# Patient Record
Sex: Male | Born: 1935 | Race: White | Hispanic: No | Smoking: Former smoker
Health system: Southern US, Community
[De-identification: ages and names within clinical notes are randomized; demographics above are authoritative.]

## PROBLEM LIST (undated history)

## (undated) DIAGNOSIS — I4891 Unspecified atrial fibrillation: Secondary | ICD-10-CM

## (undated) DIAGNOSIS — E785 Hyperlipidemia, unspecified: Secondary | ICD-10-CM

## (undated) DIAGNOSIS — C61 Malignant neoplasm of prostate: Secondary | ICD-10-CM

## (undated) DIAGNOSIS — I2510 Atherosclerotic heart disease of native coronary artery without angina pectoris: Secondary | ICD-10-CM

## (undated) HISTORY — PX: BACK SURGERY: SHX140

---

## 1998-08-13 ENCOUNTER — Emergency Department (HOSPITAL_COMMUNITY): Admission: EM | Admit: 1998-08-13 | Discharge: 1998-08-13 | Payer: Self-pay | Admitting: Emergency Medicine

## 1998-08-13 ENCOUNTER — Encounter: Payer: Self-pay | Admitting: Emergency Medicine

## 1999-01-08 ENCOUNTER — Ambulatory Visit (HOSPITAL_COMMUNITY): Admission: RE | Admit: 1999-01-08 | Discharge: 1999-01-08 | Payer: Self-pay | Admitting: Endocrinology

## 2002-03-10 ENCOUNTER — Ambulatory Visit (HOSPITAL_COMMUNITY): Admission: RE | Admit: 2002-03-10 | Discharge: 2002-03-10 | Payer: Self-pay | Admitting: *Deleted

## 2002-03-10 ENCOUNTER — Encounter (INDEPENDENT_AMBULATORY_CARE_PROVIDER_SITE_OTHER): Payer: Self-pay | Admitting: Specialist

## 2003-01-05 ENCOUNTER — Encounter: Admission: RE | Admit: 2003-01-05 | Discharge: 2003-01-05 | Payer: Self-pay | Admitting: Endocrinology

## 2003-01-05 ENCOUNTER — Encounter: Payer: Self-pay | Admitting: Endocrinology

## 2003-08-10 ENCOUNTER — Ambulatory Visit (HOSPITAL_COMMUNITY): Admission: RE | Admit: 2003-08-10 | Discharge: 2003-08-11 | Payer: Self-pay | Admitting: Cardiology

## 2004-05-22 ENCOUNTER — Ambulatory Visit (HOSPITAL_COMMUNITY): Admission: RE | Admit: 2004-05-22 | Discharge: 2004-05-22 | Payer: Self-pay | Admitting: *Deleted

## 2004-05-22 ENCOUNTER — Encounter (INDEPENDENT_AMBULATORY_CARE_PROVIDER_SITE_OTHER): Payer: Self-pay | Admitting: *Deleted

## 2005-04-12 ENCOUNTER — Encounter: Admission: RE | Admit: 2005-04-12 | Discharge: 2005-04-12 | Payer: Self-pay | Admitting: Endocrinology

## 2005-05-09 ENCOUNTER — Encounter: Admission: RE | Admit: 2005-05-09 | Discharge: 2005-05-09 | Payer: Self-pay | Admitting: Neurosurgery

## 2005-05-26 ENCOUNTER — Encounter: Admission: RE | Admit: 2005-05-26 | Discharge: 2005-05-26 | Payer: Self-pay | Admitting: Neurosurgery

## 2005-06-16 ENCOUNTER — Encounter: Admission: RE | Admit: 2005-06-16 | Discharge: 2005-06-16 | Payer: Self-pay | Admitting: Neurosurgery

## 2005-08-22 ENCOUNTER — Inpatient Hospital Stay (HOSPITAL_COMMUNITY): Admission: RE | Admit: 2005-08-22 | Discharge: 2005-08-26 | Payer: Self-pay | Admitting: Neurosurgery

## 2006-05-18 ENCOUNTER — Encounter: Admission: RE | Admit: 2006-05-18 | Discharge: 2006-05-18 | Payer: Self-pay | Admitting: Neurosurgery

## 2006-08-07 ENCOUNTER — Inpatient Hospital Stay (HOSPITAL_COMMUNITY): Admission: RE | Admit: 2006-08-07 | Discharge: 2006-08-11 | Payer: Self-pay | Admitting: Neurosurgery

## 2006-10-22 ENCOUNTER — Encounter: Admission: RE | Admit: 2006-10-22 | Discharge: 2006-10-22 | Payer: Self-pay | Admitting: Neurosurgery

## 2006-10-28 ENCOUNTER — Encounter: Admission: RE | Admit: 2006-10-28 | Discharge: 2006-10-28 | Payer: Self-pay | Admitting: Neurosurgery

## 2006-10-30 ENCOUNTER — Encounter: Admission: RE | Admit: 2006-10-30 | Discharge: 2006-10-30 | Payer: Self-pay | Admitting: Neurosurgery

## 2007-01-19 ENCOUNTER — Encounter: Admission: RE | Admit: 2007-01-19 | Discharge: 2007-01-19 | Payer: Self-pay | Admitting: Endocrinology

## 2007-01-27 ENCOUNTER — Encounter: Admission: RE | Admit: 2007-01-27 | Discharge: 2007-01-27 | Payer: Self-pay | Admitting: Otolaryngology

## 2007-02-02 ENCOUNTER — Ambulatory Visit (HOSPITAL_BASED_OUTPATIENT_CLINIC_OR_DEPARTMENT_OTHER): Admission: RE | Admit: 2007-02-02 | Discharge: 2007-02-02 | Payer: Self-pay | Admitting: Otolaryngology

## 2007-08-12 ENCOUNTER — Ambulatory Visit (HOSPITAL_COMMUNITY): Admission: RE | Admit: 2007-08-12 | Discharge: 2007-08-12 | Payer: Self-pay | Admitting: *Deleted

## 2007-08-12 ENCOUNTER — Encounter (INDEPENDENT_AMBULATORY_CARE_PROVIDER_SITE_OTHER): Payer: Self-pay | Admitting: *Deleted

## 2007-08-26 ENCOUNTER — Encounter: Admission: RE | Admit: 2007-08-26 | Discharge: 2007-08-26 | Payer: Self-pay | Admitting: Neurosurgery

## 2007-09-10 ENCOUNTER — Encounter: Admission: RE | Admit: 2007-09-10 | Discharge: 2007-09-10 | Payer: Self-pay | Admitting: Neurosurgery

## 2008-01-05 ENCOUNTER — Encounter: Admission: RE | Admit: 2008-01-05 | Discharge: 2008-01-05 | Payer: Self-pay | Admitting: Neurosurgery

## 2008-06-16 ENCOUNTER — Encounter: Admission: RE | Admit: 2008-06-16 | Discharge: 2008-06-16 | Payer: Self-pay | Admitting: Neurosurgery

## 2008-06-30 ENCOUNTER — Encounter: Admission: RE | Admit: 2008-06-30 | Discharge: 2008-06-30 | Payer: Self-pay | Admitting: Neurosurgery

## 2008-09-28 ENCOUNTER — Encounter (INDEPENDENT_AMBULATORY_CARE_PROVIDER_SITE_OTHER): Payer: Self-pay | Admitting: *Deleted

## 2008-09-28 ENCOUNTER — Ambulatory Visit (HOSPITAL_COMMUNITY): Admission: RE | Admit: 2008-09-28 | Discharge: 2008-09-28 | Payer: Self-pay | Admitting: *Deleted

## 2008-12-01 ENCOUNTER — Encounter: Admission: RE | Admit: 2008-12-01 | Discharge: 2008-12-01 | Payer: Self-pay | Admitting: Neurosurgery

## 2009-03-15 ENCOUNTER — Ambulatory Visit: Payer: Self-pay | Admitting: Vascular Surgery

## 2009-04-06 ENCOUNTER — Ambulatory Visit: Payer: Self-pay | Admitting: Vascular Surgery

## 2009-04-12 ENCOUNTER — Ambulatory Visit (HOSPITAL_COMMUNITY): Admission: RE | Admit: 2009-04-12 | Discharge: 2009-04-12 | Payer: Self-pay | Admitting: Surgery

## 2009-04-12 ENCOUNTER — Ambulatory Visit: Payer: Self-pay | Admitting: Surgery

## 2009-06-08 ENCOUNTER — Ambulatory Visit: Payer: Self-pay | Admitting: Vascular Surgery

## 2009-07-16 ENCOUNTER — Encounter: Admission: RE | Admit: 2009-07-16 | Discharge: 2009-07-16 | Payer: Self-pay | Admitting: Neurosurgery

## 2010-12-08 ENCOUNTER — Encounter: Payer: Self-pay | Admitting: Otolaryngology

## 2011-02-25 LAB — POCT I-STAT, CHEM 8
BUN: 14 mg/dL (ref 6–23)
Chloride: 108 mEq/L (ref 96–112)
Creatinine, Ser: 1.1 mg/dL (ref 0.4–1.5)
Potassium: 3.7 mEq/L (ref 3.5–5.1)
Sodium: 142 mEq/L (ref 135–145)

## 2011-03-12 ENCOUNTER — Other Ambulatory Visit: Payer: Self-pay | Admitting: Dermatology

## 2011-04-01 NOTE — Op Note (Signed)
NAME:  JEANMARC, VIERNES NO.:  [id number]   MEDICAL RECORD NO.:  [id number]          PATIENT TYPE:  AMB   LOCATION:  ENDO                         FACILITY:  [facility]   PHYSICIAN:  Georgiana Spinner, M.D.    DATE OF BIRTH:  1925/07/10   DATE OF PROCEDURE:  08/12/2007  DATE OF DISCHARGE:                               OPERATIVE REPORT   PROCEDURE:  Upper endoscopy.   INDICATIONS:  Gastroesophageal reflux disease with Barrett's esophagus.   ANESTHESIA:  Fentanyl 75 mcg, Versed 5 mg.   DESCRIPTION OF PROCEDURE:  With the patient mildly sedated in the left  lateral decubitus position, the Pentax videoscopic endoscope was  inserted and passed under direct vision through the esophagus which  appeared normal until we reached the distal esophagus and there was one  [address] of Barrett's esophagus photographed and biopsied and an  extension of the squamocolumnar junction cephalad that was also  photographed and biopsied which was probably Barrett's esophagus.  I  entered into the stomach through a hiatal hernia.  Fundus, body, antrum,  duodenal bulb, and second portion of the duodenum were visualized. From  this point, the endoscope was slowly withdrawn taking circumferential  views of the duodenal mucosa until the endoscope had been pulled back  into the stomach and placed in retroflexion to view the stomach from  below. The endoscope was straightened and withdrawn taking  circumferential views of the remaining gastric and esophageal mucosa.  The patient's vital signs and pulse oximeter remained stable.  The  patient tolerated procedure well without apparent complication.   FINDINGS:  Barrett's esophagus, biopsied.  Await biopsy report.  The  patient will call me for results and follow up with me as an outpatient.  Proceed to colonoscopy as planned.           ______________________________  Georgiana Spinner, M.D.     GMO/MEDQ  D:  08/12/2007  T:  08/12/2007  Job:   [id number]

## 2011-04-01 NOTE — Op Note (Signed)
NAME:  KEYMARI, SATO NO.:  [id number]   MEDICAL RECORD NO.:  [id number]          PATIENT TYPE:  AMB   LOCATION:  ENDO                         FACILITY:  [facility]   PHYSICIAN:  Georgiana Spinner, M.D.    DATE OF BIRTH:  10-22-1925   DATE OF PROCEDURE:  09/28/2008  DATE OF DISCHARGE:                               OPERATIVE REPORT   PROCEDURE:  Upper endoscopy with biopsy.   INDICATIONS:  GERD.   ANESTHESIA:  Fentanyl 50 mcg, Versed 7.5 mg.   DESCRIPTION OF PROCEDURE:  With the patient mildly sedated in the left  lateral decubitus position the Pentax videoscopic endoscope was inserted  in the mouth, passed under direct vision through the esophagus which  appeared normal on direct view then into the stomach, fundus, body,  antrum, duodenal bulb, second portion of duodenum were visualized.  From  this point the endoscope was slowly withdrawn taking circumferential  views of duodenal mucosa until the endoscope had been pulled back into  stomach placed in retroflexion to view the stomach from below.  The  endoscope was then straightened after a biopsy of what appeared to be a  possible small area of Barrett's was photographed in retroflex view and  biopsied.  The endoscope was straightened and withdrawn taking  circumferential views of remaining gastric and esophageal mucosa.  The  patient's vital signs and pulse oximeter remained stable.  The patient  tolerated the procedure well without apparent complications.   FINDINGS:  1. Single arteriovenous malformation in the fundus of the stomach,      photographed.  2. Question of Barrett's esophagus versus normal variant.   PLAN:  Await biopsy report.  The patient will call me for results and  follow-up with me as an outpatient.           ______________________________  Georgiana Spinner, M.D.     GMO/MEDQ  D:  09/28/2008  T:  09/28/2008  Job:  [id number]

## 2011-04-01 NOTE — Procedures (Signed)
LOWER EXTREMITY ARTERIAL EVALUATION-SINGLE LEVEL   INDICATION:  Followup evaluation of right leg stent.   HISTORY:  Diabetes:  No.  Cardiac:  No.  Hypertension:  No.  Smoking:  No.  Previous Surgery:  Right superficial femoral artery stent placed on  04/12/2009 by Dr. Myra Gianotti.   RESTING SYSTOLIC PRESSURES: (ABI)                          RIGHT                LEFT  Brachial:               132                  140  Anterior tibial:        156                  178  Posterior tibial:       180                  178  Peroneal:  DOPPLER WAVEFORM ANALYSIS:  Anterior tibial:        Biphasic             Biphasic  Posterior tibial:       Triphasic            Triphasic  Peroneal:   PREVIOUS ABI'S:  Date:  03/15/2009  RIGHT:  0.77  LEFT:  >1.0   IMPRESSION:  1. Right ABI has increased since previous study.  2. Left ABI is stable compared to previous study.  3. ABIs and Doppler arterial waveforms suggest no significant arterial      occlusive disease bilaterally and patent right superficial femoral      artery stent.   ___________________________________________  Larina Earthly, M.D.   MC/MEDQ  D:  06/08/2009  T:  06/08/2009  Job:  [id number]

## 2011-04-01 NOTE — Consult Note (Signed)
NEW PATIENT CONSULTATION   Todd Wilkinson, Todd Wilkinson  DOB:  08/10/1925                                       04/06/2009  CHART#:[id number]   Todd Wilkinson presents today for evaluation of right calf claudication  symptoms.  He is a very pleasant, active 77 year old gentleman who  reports that over the past several months has increasingly severe  disability related to claudication in his right calf.  He reports that  if he continues to push, that he will have thigh and hip claudication as  well.  He denies any rest pain.  He has never had any episodes of tissue  loss.  He reports this is worse if he tries to walk fast or up a hill.   PAST MEDICAL HISTORY:  Remarkable for very good health.  He denies major  medical difficulties such as cardiac dysfunction.   SOCIAL HISTORY:  He is married with 2 children.  He is retired.  He does  not smoke or drink alcohol.   REVIEW OF SYSTEMS:  Negative for cardiac, pulmonary, GI or GU  discomfort.   ALLERGIES TO MEDICATIONS:  None.   CURRENT MEDICATIONS:  Advil, lorazepam, hydrocodone as needed for back  pain.  He has had multiple back surgeries.   PHYSICAL EXAMINATION:  Well-developed, well-nourished white male  appearing younger than stated age of 70.  His blood pressure is 161/88, pulse of 80, respirations 18.  His radial pulses are 2+ bilaterally.  His carotid arteries are without  bruits bilaterally.  He is grossly intact neurologically.  His femoral  pulses are 2+ bilaterally.  I do not palpate a pedal pulse on the right.  He does have 2+ dorsalis pedis pulse on the left.   I reviewed his noninvasive vascular laboratory studies with him from  March 15, 2009.  This reveals normal pressures and waveforms on the  left.  On the right he has monophasic waveforms with an ankle-arm index  of 0.77.  I have recommended arteriography for further evaluation.  I  explained that he may be a candidate for stenting depending on the level  of  his stenosis and occlusive disease.  He understands that we will  proceed with arteriography as an outpatient on Apr 12, 2009.   Larina Earthly, M.D.  Electronically Signed   TFE/MEDQ  D:  04/06/2009  T:  04/09/2009  Job:  [id number]   cc:   Payton Doughty, M.D.  Brooke Bonito, M.D.

## 2011-04-01 NOTE — Op Note (Signed)
NAME:  Todd Wilkinson, Todd Wilkinson NO.:  [id number]   MEDICAL RECORD NO.:  [id number]          PATIENT TYPE:  AMB   LOCATION:  ENDO                         [facility]ACILITY:  [facility]   PHYSICIAN:  Georgiana Spinner, M.D.    DATE OF BIRTH:  [birth year]   DATE OF PROCEDURE:  08/12/2007  DATE OF DISCHARGE:                               OPERATIVE REPORT   PROCEDURE:  Colonoscopy.   ANESTHESIA:  Fentanyl 25 mcg, Versed 2 mg.   DESCRIPTION OF PROCEDURE:  With the patient mildly sedated in the left  lateral decubitus position, a rectal examination was performed.  The  prostate felt symmetrically enlarged but no nodules were noted.  Subsequently, the Pentax videoscopic colonoscope was inserted into the  rectum and passed under direct vision to the cecum identified by the  ileocecal valve and appendiceal orifice, both of which were  photographed. In the cecum were two small polyps that were removed using  hot biopsy forceps technique setting of 20/150 blended current.  From  this point, the colonoscope was slowly withdrawn taking circumferential  views of the remaining colonic mucosa stopping first at the hepatic  flexure where a small polyp was seen, photographed, and it too was  removed, this time using snare cautery technique with the same setting.  Tissue was retrieved by suctioning through a tissue trap. The endoscope  was then withdrawn all the way to 40 cm from anal verge at which point  two small polyps were again seen, both of these were also removed using  hot biopsy forceps technique with the same setting. We next stopped in  the rectum which appeared normal on direct and showed hemorrhoids on  retroflexed view. The endoscope was straightened and withdrawn.  The  patient's vital signs and pulse oximeter remained stable.  The patient  tolerated the procedure well without apparent complications.   FINDINGS:  Internal hemorrhoids, polyp at 40 cm, polyp at hepatic  flexure, and polyp at  the cecum.   PLAN:  Await biopsy report.  The patient will call me for results and  follow up with me as an outpatient.           ______________________________  Georgiana Spinner, M.D.     GMO/MEDQ  D:  08/12/2007  T:  08/12/2007  Job:  [id number]

## 2011-04-01 NOTE — Assessment & Plan Note (Signed)
OFFICE VISIT   URI, TURNBOUGH  DOB:  Nov 12, 1925                                       06/08/2009  DGUYQ#:[id number]   The patient presents today for followup of his aortogram and subsequent  stenting of his right superficial femoral artery on 04/12/2009.  He  reports that he is not able to walk to his full capacity due to the hot  weather but reports that he has had no calf claudication.  He did have  successful stenting of his right superficial femoral artery.   PHYSICAL EXAM:  His femoral pulses are 2+.  He has 2+ popliteal pulses  bilaterally and palpable posterior tibial pulses.  His ankle arm index  has returned to normal bilaterally up from 0.77 on the right previously.   I am quite pleased with his initial result as is the patient.  He will  continue his walking program.  I explained that there chance for  recurrence of his superficial femoral artery occlusive disease and if so  we would recommend treatment depending on his level of disability.  He  will follow up in our office with vascular lab protocol.   Larina Earthly, M.D.  Electronically Signed   TFE/MEDQ  D:  06/08/2009  T:  06/09/2009  Job:  [id number]   cc:   Brooke Bonito, M.D.  Payton Doughty, M.D.  Jorge Ny, MD

## 2011-04-01 NOTE — Op Note (Signed)
NAME:  Todd, Wilkinson                ACCOUNT NO.:  [id number]   MEDICAL RECORD NO.:  [id number]          PATIENT TYPE:  AMB   LOCATION:  SDS                          FACILITY:  [facility]   PHYSICIAN:  Juleen China IV, MDDATE OF BIRTH:  [birth year]   DATE OF PROCEDURE:  04/12/2009  DATE OF DISCHARGE:  04/12/2009                               OPERATIVE REPORT   DIAGNOSIS:  Right leg claudication.   PROCEDURE:  1. Ultrasound access, left femoral artery.  2. Abdominal aortogram.  3. Bilateral lower extremity runoff.  4. Third-order catheterization.  5. Stent, right superficial femoral artery.   INDICATIONS FOR PROCEDURE:  This is an 77 year old gentleman with severe  claudication in his right leg.  Duplex reveals diminished ankle brachial  indices on the right, normal on the left.  The patient comes in today  for arteriogram, possible intervention.   PROCEDURE:  The patient was identified in the holding area and taken to  the room where he was placed supine on the table.  Bilateral groins were  prepped and draped in standard sterile fashion.  Time-out was called.  The left femoral artery was evaluated with ultrasound and found to be  widely patent.  Lidocaine 1% was used for local anesthesia.  Left  femoral artery was accessed under ultrasound guidance with an 18-gauge  needle.  An 0.35 Teena Dunk wire was advanced in retrograde fashion to the  abdominal aorta under fluoroscopic visualization.  A 5-French sheath was  placed.  Over the wire, an Omni flush catheter was placed at the level  of L1.  Abdominal aortogram was obtained.  Next, the catheter was pulled  down to the aortic bifurcation.  Pelvic angiogram was obtained.  The  aortic bifurcation was then crossed using the Omni flush catheter and a  Benson wire.  The catheter was placed in the right external iliac artery  and right lower extremity runoff was obtained.  Left leg runoff was  performed from retrograde injections to the  sheath.   FINDINGS:  Aortogram:  The visualized portions of the suprarenal  abdominal aorta showed minimal disease.  There are single renal  arteries, which are widely patent.  The infrarenal abdominal aorta is  widely patent without significant disease.   Pelvic Angiogram:  Bilateral common iliac arteries are widely patent  without significant disease.  Bilateral hypogastric arteries are widely  patent without significant disease.  Bilateral external iliac arteries  are widely patent without significant disease.   Right Lower Extremity:  The right common femoral artery is widely patent  without significant disease.  The right profunda femoral artery is  widely patent.  The right superficial femoral artery is widely patent,  however, at the level of the adductor canal, there is a high grade  approximately 95% stenosis.  The popliteal arteries are patent  throughout its course.  There was 2-vessel runoff via the posterior  tibial and peroneal artery.  The anterior tibial artery is chronically  occluded.   Left Lower Extremity:  Left common femoral artery is widely patent  without significant disease.  The left profunda  femoral artery is widely  patent.  The left superficial femoral artery is widely patent.  The left  popliteal artery is widely patent.  There is 2 vessel runoff via the  posterior tibial and peroneal artery.  The anterior tibial artery is  occluded.  At this point in time, the decision was made to intervene  over a Rosen wire.  A 7-French sheath was placed to the right external  iliac artery.  The patient was then given systemic heparinization using  a 7 x 2 balloon catheter and a Benson wire access was obtained distal to  the lesion.  A long Rosen wire was then placed.  Contrast injections  were then performed to localize the lesion.  Once this had been done, a  Cordis 8 x 30 self-expanding stent was deployed across the lesion.  This  was molded to confirmation with a  7 x 2 balloon.  Followup arteriogram  revealed significant reduction in the stenosis less than 10%, runoff  remained unchanged.  At this point in time, the decision was made to  terminate the procedure.  Sheath was pulled back to the left external  iliac artery.  The patient will be taken to the holding area for sheath  pull once his coagulation profile corrects.   IMPRESSION:  High-grade right superficial femoral artery stenosis,  successfully treated using a Cordis 8 x 30 self-expanding stent.      Todd Ny, MD  Electronically Signed     VWB/MEDQ  D:  04/12/2009  T:  04/12/2009  Job:  [id number]

## 2011-04-04 NOTE — H&P (Signed)
NAME:  Todd Wilkinson, Todd Wilkinson NO.:  [id number]   MEDICAL RECORD NO.:  [id number]          PATIENT TYPE:  INP   LOCATION:  2856                         FACILITY:  [facility]   PHYSICIAN:  Payton Doughty, M.D.      DATE OF BIRTH:  1925/03/15   DATE OF ADMISSION:  08/22/2005  DATE OF DISCHARGE:                                HISTORY & PHYSICAL   ADMITTING DIAGNOSES:  Spondylosis at L3-4.   This is a very nice 77 year old right-handed white gentleman I operated on  10 years ago for spondylosis at L2-3 and he did well.  Came to the office in  June.  Had been having increasing pain in back, down his left leg.  MR  demonstrated a foraminal disk at L3-4.  He underwent epidural steroids to no  avail and is now admitted for extension of his 2-3 fusion down to 3-4.   MEDICAL HISTORY:  Unremarkable for significant diseases.  He has just had  the back operations in the past.  He has been on Voltaren in the past.   SOCIAL HISTORY:  He does not smoke.  Drinks alcohol on a social basis.  Avid  golfer.  He is retired now.   No allergies.   PHYSICAL EXAMINATION:  HEENT:  Within normal limits.  NECK:  He has good range of motion of the neck.  CHEST:  Clear.  CARDIAC:  Unremarkable.  ABDOMEN:  Nontender.  No hepatosplenomegaly.  EXTREMITIES:  Without clubbing or cyanosis.  GENITOURINARY:  Deferred.  PERIPHERAL PULSES:  Good.  NEUROLOGIC:  He is awake, alert, and oriented.  His cranial nerves are  intact.  Motor examination demonstrates 5/5 strength throughout the upper  and lower extremities.  He has a very positive straight leg raise on the  right side and he has absent knee jerk on the right.   MR results have been reviewed above.   CLINICAL IMPRESSION:  Herniated disk at 3-4 unresponsive to treatment.   PLAN:  Laminectomy, diskectomy, posterior __________ fusion at L3-4 and  extension into pedicle screw construct from L2 down to L4.  The risks and  benefits of this approach have been  discussed with him and he wished to  proceed.          ______________________________  Payton Doughty, M.D.    MWR/MEDQ  D:  08/22/2005  T:  08/22/2005  Job:  [id number]

## 2011-04-04 NOTE — Op Note (Signed)
NAME:  Todd Wilkinson, Todd Wilkinson                          ACCOUNT NO.:  [id number]   MEDICAL RECORD NO.:  [id number]                   PATIENT TYPE:  AMB   LOCATION:  ENDO                                 FACILITY:  [facility]   PHYSICIAN:  Georgiana Spinner, M.D.                 DATE OF BIRTH:  February 17, 1925   DATE OF PROCEDURE:  05/22/2004  DATE OF DISCHARGE:                                 OPERATIVE REPORT   PROCEDURE:  Upper endoscopy with biopsy.   INDICATIONS:  Barrett's esophagus.   ANESTHESIA:  1. Demerol 50.  2. Versed 5 mg.   PROCEDURE:  With the patient mildly sedated in the left lateral decubitus  position, the Olympus videoscopic endoscope was inserted in the mouth,  passed under direct vision through the esophagus which appeared normal  except for a question of an early stricture.  We entered into the distal  esophagus and there were changes of Barrett's photographed and biopsied.  We  entered then into the stomach, fundus, body, antrum.  The duodenal bulb,  second portion of duodenum were visualized.  From this point the endoscope  was slowly withdrawn, taking circumferential views of duodenal mucosa until  the endoscope had been pulled back and the stomach placed in retroflexion to  view the stomach from below.  The endoscope was then straightened and  withdrawn, taking circumferential views of the remaining gastric and  esophageal mucosa.  The patient's vital signs and pulse oximeter remained  stable.  The patient tolerated the procedure well with no apparent  complications.   FINDINGS:  Barrett's esophagus seen and biopsied, await biopsy report.  Patient will call me for results and followup with me as an outpatient,  proceed to colonoscopy.                                               Georgiana Spinner, M.D.    GMO/MEDQ  D:  05/22/2004  T:  05/22/2004  Job:  [id number]

## 2011-04-04 NOTE — Op Note (Signed)
NAME:  Todd Wilkinson, Todd Wilkinson NO.:  [id number]   MEDICAL RECORD NO.:  [id number]          PATIENT TYPE:  INP   LOCATION:  2899                         FACILITY:  [facility]   PHYSICIAN:  Payton Doughty, M.D.      DATE OF BIRTH:  07/01/1925   DATE OF PROCEDURE:  08/07/2006  DATE OF DISCHARGE:                                 OPERATIVE REPORT   PREOPERATIVE DIAGNOSIS:  Spondylosis, L4-5.   POSTOPERATIVE DIAGNOSIS:  Spondylosis, L4-5.   OPERATIVE PROCEDURES:  L4-5 laminectomy and diskectomy, posterior lumbar  interbody fusion with Ray fusion cages; segmental pedicle screw fixation  from L4 to S1; posterolateral arthrodesis, L3 to S1.   SURGEON:  Payton Doughty, M.D.   DOCTOR ASSISTANT:  Clydene Fake, M.D.   NURSE ASSISTANT:  [place].   ANESTHESIA:  General endotracheal.   PREPARATION:  Prep was done with Betadine Scrub and alcohol wipe.   COMPLICATIONS:  None.   BODY OF TEXT:  This is a 77 year old gentleman with a prior fusion at 2-3  and 3-4, with transitional segment disease at 4-5, taken to the operating  room, smoothly anesthetized, intubated, placed prone on the operating table.  Following shave, prep and drape in the usual sterile fashion, the skin was  infiltrated with 1% lidocaine with 1:400,000 epinephrine, and the skin was  incised from L2 to S1.  The pedicle screws at L2, L3 and L4, the lamina of  L4, L5 and S1, and the transverse processes of L3, L4, L5 and S1 were  exposed bilaterally in the subperiosteal plane.  The pars interarticularis,  lamina and inferior facet of L4, and the superior facet of L5 were removed  using the high-speed drill.  L4 and 5 were decompressed.  There was a lot of  spondylosis with severe compression of the 4 and the 5 roots, particularly  off to the left side.  Following complete decompression, a 14 x 21-mm Ray  fusion cages were placed and packed with bone graft harvested from the facet  joints.  Pedicle screws were then placed  in L5 and S1, and the old rod was  removed from the other screws and the new rod placed, 120 mm, that extended  from L2 to S1.  The transverse processes and lamina of S1 were decorticated  with a high-speed drill.  BMP on the extender matrix was placed bilaterally.  Intraoperative x-rays showed good placement of Ray cages, pedicle screws and  rods.  Final tightening was undertaken.  The fascia and subcutaneous tissues  were reapproximated with 0 Vicryl in  interrupted fashion.  Subcuticular tissues were reapproximated with 2-0  Vicryl in interrupted fashion.  The skin was closed with 3-0 nylon in a  running, locked fashion.  Betadine and Telfa dressing were applied and made  occlusive with OpSite, and the patient returned to the recovery room in good  condition.           ______________________________  Payton Doughty, M.D.     MWR/MEDQ  D:  08/07/2006  T:  08/07/2006  Job:  [id number]  cc:   Clydene Fake, M.D.

## 2011-04-04 NOTE — H&P (Signed)
NAME:  Todd Wilkinson, Todd Wilkinson NO.:  [id number]   MEDICAL RECORD NO.:  [id number]          PATIENT TYPE:  INP   LOCATION:  3006                         FACILITY:  [facility]   PHYSICIAN:  Payton Doughty, M.D.      DATE OF BIRTH:  1925/02/13   DATE OF ADMISSION:  08/07/2006  DATE OF DISCHARGE:                                HISTORY & PHYSICAL   ADMITTING DIAGNOSES:  Blood loss.  16109-6.   Dictating doctor, Dr. Roy/Neurosurgery.   This is a 77 year old gentleman who has been infused at 2-3 and 3-4 in the  past, has developed transitional segment disease at 4-5 and has severe left  leg pain, is now admitted for decompression and fusion, extension down to 4-  5 and 5-1.   MEDICAL HISTORY:  Is remarkable for a little bit of prostatic hypertrophy.  He takes UroXatral 10 mg a day, uses Nexium 40 mg a day, MiraLax, oxycodone  on a p.r.n. basis, Ativan on a p.r.n. basis.   ALLERGIES:  NONE.   SURGICAL HISTORY:  Lumbar fusions as noted above.   SOCIAL HISTORY:  He does not smoke, drinks alcohol on a social basis and  golfs a lot.   REVIEW OF SYSTEMS:  Remarkable for back pain, leg pain, wearing glasses.  HEENT:  Within normal limits.  Neck:  He has a reasonable range of motion in  his neck.  Chest:  Clear.  Cardiac:  Regular rate and rhythm.  Abdomen:  Nontender with no hepatosplenomegaly.  Extremities:  Without clubbing or  cyanosis.  GU:  Deferred.  Extremities:  Peripheral pulses are good.  Neurologic:  He is awake, alert and oriented.  His cranial nerves are  intact.  Motor exam shows 5/5 strength throughout the upper and lower  extremities.  Same for the dorsiflexors on the left that are 5-/5.  There is  a left L5 sensory deficit.  Reflexes are 2 at the knees, absent at the  ankles, toes downgoing bilaterally.  Straight leg raise is positive on the  left.   Review of his MR and plain films show effusion with no loosening at 2-3 and  3-4, transitional disease at 4-5,  relatively normal, although somewhat  degenerated disk at 5-1.   CLINICAL IMPRESSION:  Left L5 radiculopathy with spondylosis and progressive  transitional segment disease at 4-5.   PLAN:  Effusion at 4-5 and 5-1, an incorporation of __________  to the 2-3,  3-4 fusion.  The risks and benefits has been discussed with him.  He wishes  to proceed.          ______________________________  Payton Doughty, M.D.    MWR/MEDQ  D:  08/07/2006  T:  08/09/2006  Job:  [id number]

## 2011-04-04 NOTE — Discharge Summary (Signed)
NAME:  Todd Wilkinson, Todd Wilkinson NO.:  [id number]   MEDICAL RECORD NO.:  [id number]          PATIENT TYPE:  INP   LOCATION:  3009                         FACILITY:  [facility]   PHYSICIAN:  Payton Doughty, M.D.      DATE OF BIRTH:  [birth year]   DATE OF ADMISSION:  08/22/2005  DATE OF DISCHARGE:  08/26/2005                                 DISCHARGE SUMMARY   ADMISSION DIAGNOSIS:  Spondylosis, L3-4.   DISCHARGE DIAGNOSIS:  Spondylosis, L3-4.   OPERATIVE PROCEDURE:  L3-4 laminectomy, diskectomy, posterior lumbar  interbody fusion, Ray Threaded Fusion Cage, pedicle screws, posterolateral  arthrodesis.   COMPLICATIONS:  None.   DISCHARGE STATUS:  Alive and well.   BODY OF TEXT:  A 77 year old gentleman whose history and physical is  recounted on the chart.  He had a fusion at L2-3 numerous years ago and has  developed adjacent segment disease at L3-4.   Medical history is remarkable for coronary artery disease.  He has a stent  in place.  Has no angina.   He was admitted after ascertainment of normal laboratory values and  underwent extensive __________ and fusion down to L3-4.  Postoperatively he  has done well.  He was on a PCA for two days, that was stopped.  He is on  oral pain medications and Lyrica now.  He has some pain in his left hip, but  his strength is full.  His incision is dry and well-healing.  He is being  discharged home in the care of his family on Lyrica and Percocet.  His  follow-up will be in the [facility] office in a week  for sutures.           ______________________________  Payton Doughty, M.D.     MWR/MEDQ  D:  08/26/2005  T:  08/26/2005  Job:  [id number]

## 2011-04-04 NOTE — Op Note (Signed)
NAME:  [name], [name]                ACCOUNT NO.:  [id number]   MEDICAL RECORD NO.:  [id number]          PATIENT TYPE:  AMB   LOCATION:  DSC                          FACILITY:  [facility]   PHYSICIAN:  Christopher E. 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   POSTOPERATIVE DIAGNOSIS:  Left laryngeal mass versus inflammation.   OPERATION:  Direct laryngoscopy, cervical esophagoscopy.   SURGEON:  Narda Bonds, M.D.   ANESTHESIA:  General.   COMPLICATIONS:  None.   CLINICAL NOTE:  Todd Wilkinson is a 77 year old gentleman who about 2 weeks  ago developed relatively sudden onset of hoarseness and difficulty  swallowing.  He was seen in the office with left vocal cord paralysis,  tendency for aspiration with complaints of left ear pain and left throat  discomfort.  He subsequently underwent a MRI scan and on the MRI scan  this was suggestive of a mass in the left side of the larynx around the  region of the left arytenoid extending epiglottic as well supraglottic.  Since the MRI scan, his symptoms and actually improved and on repeat  exam in the office, he appeared to regain some mobility of the left  vocal cord with a little bit improvement of his  voice but still having  some difficulty swallowing but not as much.  Because of findings on MRI  scan suggesting a possible neoplasm on the left side of larynx, direct  laryngoscopy and cervical esophagoscopy is performed at this time.   DESCRIPTION OF PROCEDURE:  After adequate endotracheal anesthesia with a  #6 endotracheal tube, first direct laryngoscopy was performed.  On  evaluation of the vocal cords, both vocal cords appeared symmetric and  clear.  There is no vocal cord abnormalities, subglottic area likewise  appeared clear with no subglottic mass noted.  Evaluation of the  left  arytenoid revealed some erythema, slight swelling, fullness of the left  arytenoid but this was mucosal covered and was soft to palpation with  the biopsy forceps.  Postcricoid area was likewise evaluated with direct  laryngoscopy and postcricoid area was mucosalized with no obvious  lesions noted.  The piriform sinus was clear.  Cervical esophagoscopy  was performed and on cervical esophagoscopy the upper cervical esophagus  had no mucosal abnormalities noted.  Several photos were obtained.  No  biopsies were obtained as everything was mucosal covered, there was no  mucosal lesions.  There is some slight erythema and fullness of the left  arytenoid area but this was fairly minimal.  This completed the  procedure.  [name] was awoke from anesthesia and transferred to the  recovery room postop doing well.   DISPOSITION:  [name] is discharged home later this morning, placed on  Keflex 500 mg b.i.d. for 1 week. Will have him follow-up in my office in  1 week for recheck.  If inflammatory symptoms persist, may consider use  of steroid dose pack.  Will follow-up in one  week for recheck.           ______________________________  Kristine Garbe. Ezzard Standing, M.D.     CEN/MEDQ  D:  02/02/2007  T:  02/02/2007  Job:  [id number]   cc:   Brooke Bonito, M.D.

## 2011-04-04 NOTE — Discharge Summary (Signed)
NAME:  Todd Wilkinson, CUMMING NO.:  [id number]   MEDICAL RECORD NO.:  [id number]          PATIENT TYPE:  INP   LOCATION:  3006                         FACILITY:  [facility]   PHYSICIAN:  Payton Doughty, M.D.      DATE OF BIRTH:  1925/05/26   DATE OF ADMISSION:  08/07/2006  DATE OF DISCHARGE:  08/11/2006                                 DISCHARGE SUMMARY   ADMITTING DIAGNOSIS:  Spondylosis L4-5, L5-S1.   DISCHARGE DIAGNOSIS:  Spondylosis L4-5, L5-S1.   OPERATION/PROCEDURE:  Was L4-5 laminectomy discectomy posterior lumbar  __________ fusion cages.  L4-5, L5-S1 segmental Pedicle screw fixation and  posterolateral arthrodesis.   COMPLICATIONS:  None.   HISTORY OF PRESENT ILLNESS:  This is a 77 year old gentleman whose history  and physical is recounted in the chart.  He has been fused 2-3 and 3-4.  He  developed spondylosis at 4-5 with severe L5 radiculopathy.  He was admitted  after I ascertained normal laboratory values and underwent fusions as  outlined above.  Postoperatively, he has done extremely well with intact  strength and sensation and no leg pain.  He spent a day in the step down  unit with monitoring, had no difficulties there.  He has been transferred  out to floor.  He has developed a small amount of swelling of his incision.  It is not particularly worrisome.  There is no redness.  He has no fever and  no drainage.  He has done well in physical therapy.  He is currently ready  to be discharged home in the care of his family with Percocet for pain,  Cipro for catheter coverage for about 5 days.  He is to follow up with me in  the [facility] Office in about 10 days for suture  removal.           ______________________________  Payton Doughty, M.D.     MWR/MEDQ  D:  08/11/2006  T:  08/11/2006  Job:  [id number]

## 2011-04-04 NOTE — Procedures (Signed)
[facility]  Patient:    MARTINEZ, BOXX Visit Number: [id number] MRN: [id number]          Service Type: END Location: ENDO Attending Physician:  Sabino Gasser Dictated by:   Sabino Gasser, M.D. Proc. Date: 03/10/02 Admit Date:  03/10/2002   CC:         Bernadene Person, M.D.   Procedure Report  PROCEDURE:  Colonoscopy.  INDICATION:  Colon polyps.  ANESTHESIA:  Demerol 60, Versed 4 mg.  DESCRIPTION OF PROCEDURE:  With the patient mildly sedated in the left lateral decubitus position, a rectal exam was performed which was unremarkable. Subsequently, the Olympus videoscopic colonoscope was inserted into the rectum and passed under direct vision to the cecum, identified by the ileocecal valve and appendiceal orifice, both of which were photographed.  From this point, the colonoscope was slowly withdrawn, taking circumferential views of the entire colonic mucosa, stopping first at 25 cm from the anal verge, at which point a very large polyp was seen on a pedicle.  This was photographed and subsequently using snare cautery technique setting of 20-20 blended current, it was removed, and there was good hemostasis.  We then had to grasp this with a Roth retrieval basket to ensnare it and bring this out through the rectum. We then reinserted the endoscope back to this level where there was some residual polypoid tissue seen, and it too was removed by using snare cautery technique again a setting of 20-20 blended current, and it was suctioned through the scope.  We then used the hot biopsy forceps technique to grasp one small area of residual polyp and remove it using cautery.  Once accomplished, the endoscope was withdrawn approximately 5 cm, where a second polyp was seen, and it too was removed using snare cautery technique again a setting of 20-20 blended current.  The endoscope was then withdrawn to the rectum which appeared normal on direct and  retroflexed view.  The endoscope was straightened and withdrawn.  The patients vital signs and pulse oximeter remained stable.  The patient tolerated the procedure well without apparent complications.  FINDINGS:  One large and one moderately large polyp found in the sigmoid colon.  PLAN: 1. Await biopsy reports. 2. The patient will call me for results and follow up with me as an    outpatient. Dictated by:   Sabino Gasser, M.D. Attending Physician:  Sabino Gasser DD:  03/10/02 TD:  03/10/02 Job: [id number]

## 2011-04-04 NOTE — Op Note (Signed)
NAME:  Todd Wilkinson, Todd Wilkinson NO.:  [id number]   MEDICAL RECORD NO.:  [id number]          PATIENT TYPE:  INP   LOCATION:  2856                         FACILITY:  [facility]   PHYSICIAN:  Payton Doughty, M.D.      DATE OF BIRTH:  [birth year]   DATE OF PROCEDURE:  08/22/2005  DATE OF DISCHARGE:                                 OPERATIVE REPORT   PREOPERATIVE DIAGNOSIS:  Spondylosis L3-L4.   POSTOPERATIVE DIAGNOSIS:  Spondylosis L3-L4.   OPERATIVE PROCEDURE:  L3-L4 laminectomy, discectomy, posterior lumbar  interbody fusion with Ray fusion cages, nonsegmental pedicle screw fixation,  and posterolateral arthrodesis.   SURGEON:  Payton Doughty, M.D.   SERVICE:  Neurosurgery   ANESTHESIA:  General endotracheal anesthesia.   PREPARATION:  Betadine and alcohol wipe.   COMPLICATIONS:  None.   ASSISTANT:  Danae Orleans. Venetia Maxon, M.D.   BODY OF TEXT:  77 year old gentleman with severe spondylosis at L3-L4, taken  to the operating room, smoothly anesthetized and intubated, placed prone on  the operating table.  Following shave, prep, and drape in the usual sterile  fashion, the old skin incision was reopened and the pedicle screws at L2 and  L3 were identified.  Working inferiorly to that, the lamina of L3 and the  transverse process and pedicle of L4 were identified.  Interoperative x-ray  confirmed correct level.  Having confirmed correct level, the pars intra-  articularis, lamina, and inferior facet of L3 and superior facet of L4 were  removed bilaterally using the high speed drill.  This allowed removal of the  3-4 disc as well as dissection of the L3 and L4 nerve roots as they  traversed this area.  Following complete decompression of the nerve roots  and discectomy, fusion cages 12 by 21 mm were placed.  They were packed with  bone graft harvested from the facet joints.  The old hardware was removed at  L2 and L3 and replaced with 90D screws, 6.25 by 40 mm.  The pedicle at L4  was  probed and pedicle screws placed there.  The interoperative x-ray showed  good placement of the screws and the rod was attached to them after  appropriate contouring.  The transverse process of L3 and L4 were  decorticated with the high speed drill and BNP as infused was placed in the  intertransverse position.  Interoperative x-ray showed good placement of the Ray cages, pedicle screws,  and rods.  The caps were tightened appropriately.  The incision was closed  with successive layers of 0 Vicryl, 2-0 Vicryl, and 3-0 nylon.  The wound  was dressed with Betadine, Telfa, and OpSite.  The patient returned to the  recovery room in good condition.           ______________________________  Payton Doughty, M.D.     MWR/MEDQ  D:  08/22/2005  T:  08/23/2005  Job:  [id number]

## 2011-04-04 NOTE — Op Note (Signed)
NAME:  Todd Wilkinson, Todd Wilkinson                          ACCOUNT NO.:  [id number]   MEDICAL RECORD NO.:  [id number]                   PATIENT TYPE:  AMB   LOCATION:  ENDO                                 FACILITY:  [facility]   PHYSICIAN:  Georgiana Spinner, M.D.                 DATE OF BIRTH:  1925-10-26   DATE OF PROCEDURE:  05/22/2004  DATE OF DISCHARGE:                                 OPERATIVE REPORT   PROCEDURE:  Colonoscopy.   INDICATIONS:  Colon polyps.   ANESTHESIA:  1. Demerol 20.  2. Versed 2 mg.   PROCEDURE:  With patient mildly sedated in the left lateral decubitus  position rectal exam was performed which was unremarkable.  Subsequently,  the Olympus videoscopic colonoscope was inserted in the rectum and passed  under direct vision to the cecum, identified by ileocecal valve and  appendiceal orifice, both of which were photographed.  We viewed the  ileocecal valve and I could not tell if there was not adenomatous change of  the valve.  This was photographed and subsequently we placed the endoscope  in a retroflexed view in the cecum to view the valve from below and it  appeared that this was probably just valvular tissue, but I elected to  biopsy it to be certain.  Then centrally the endoscope was then straightened  and withdrawn, taking circumferential views of the remaining colonic mucosa,  stopping at 20 cm from the anal verge, at which point a polyp was seen,  photographed and removed using hot biopsy forceps.   TECHNIQUE:  At this time, with a setting of 20/200, the endoscope was  withdrawn to the rectum and in the rectum there was what appeared to be a  polyp very distal, just above the dentate line.  This was photographed and  removed using snare cautery technique.  We only got part of the polyp in  this view, so we straightened the endoscope and removed the remainder of the  tissue on direct view.  We also used hot biopsy forceps technique to  cauterize the base.  There was no  bleeding seen and there was only residual  burnt tissue which was photographed.  The endoscope was withdrawn.  The  patient's vital signs and pulse oximetry remained stable.  Patient tolerated  the procedure well without apparent complications.   FINDINGS:  Polyp at 20 cm from the anal verge, polyp of anal canal, and  question of polyp at the ileocecal valve.   PLAN:  Await biopsy reports.  Patient will call me for results and followup  with me as an outpatient.  Avoid aspirin for 2 weeks.  Georgiana Spinner, M.D.    GMO/MEDQ  D:  05/22/2004  T:  05/22/2004  Job:  [id number]

## 2011-04-04 NOTE — Procedures (Signed)
[facility]  Patient:    Todd Wilkinson, Todd Wilkinson Visit Number: [id number] MRN: [id number]          Service Type: END Location: ENDO Attending Physician:  Sabino Gasser Dictated by:   Sabino Gasser, M.D. Proc. Date: 03/10/02 Admit Date:  03/10/2002   CC:         Bernadene Person, M.D.   Procedure Report  PROCEDURE:  Upper endoscopy.  INDICATION:  Gastroesophageal reflux disease.  ANESTHESIA:  Demerol 60, Versed 6 mg.  DESCRIPTION OF PROCEDURE:  With patient mildly sedated in the left lateral decubitus position, the Olympus videoscopic endoscope was inserted into the mouth and then passed under into the esophagus under direct vision until we reached the distal esophagus where Barretts esophagus was seen and photographed and biopsied.  We entered into the stomach through a hiatal hernia.  Fundus, body, antrum, duodenal bulb, and second portion of duodenum appeared normal.  From this point, the endoscope was slowly withdrawn, taking circumferential views of the entire duodenal mucosa until the endoscope then pulled back into the stomach and placed in retroflexion to view the stomach from below.  The endoscope was then straightened and withdrawn, taking circumferential views of the remaining gastric and esophageal mucosa, stopping at the proximal esophagus where what appeared to be a gastric inlet patch was seen and biopsied.  The patients vital signs and pulse oximeter remained stable.  The patient tolerated the procedure well without apparent complications.  FINDINGS: 1. Barretts esophagus clearly seen, photographed and biopsied. 2. Gastric inlet patch, rule out Barretts, was biopsied.  PLAN: 1. Await biopsy report. 2. The patient will call me for results and follow up with me as an    outpatient. 3, Proceed to colonoscopy as planned. Dictated by:   Sabino Gasser, M.D. Attending Physician:  Sabino Gasser DD:  03/10/02 TD:  03/10/02 Job:  [id number]

## 2011-07-16 ENCOUNTER — Other Ambulatory Visit: Payer: Self-pay | Admitting: Dermatology

## 2011-11-17 ENCOUNTER — Other Ambulatory Visit: Payer: Self-pay | Admitting: Dermatology

## 2012-06-04 ENCOUNTER — Encounter: Payer: Self-pay | Admitting: Vascular Surgery

## 2012-09-22 ENCOUNTER — Other Ambulatory Visit: Payer: Self-pay | Admitting: Dermatology

## 2013-03-24 ENCOUNTER — Other Ambulatory Visit: Payer: Self-pay | Admitting: Dermatology

## 2013-04-05 ENCOUNTER — Other Ambulatory Visit: Payer: Self-pay | Admitting: *Deleted

## 2013-04-07 ENCOUNTER — Other Ambulatory Visit (HOSPITAL_COMMUNITY): Payer: Self-pay | Admitting: Vascular Surgery

## 2013-04-07 ENCOUNTER — Other Ambulatory Visit (HOSPITAL_COMMUNITY): Payer: Self-pay | Admitting: Cardiovascular Disease

## 2013-04-07 DIAGNOSIS — R0989 Other specified symptoms and signs involving the circulatory and respiratory systems: Secondary | ICD-10-CM

## 2013-04-07 DIAGNOSIS — I714 Abdominal aortic aneurysm, without rupture: Secondary | ICD-10-CM

## 2013-04-12 ENCOUNTER — Encounter: Payer: Self-pay | Admitting: Cardiovascular Disease

## 2013-04-13 ENCOUNTER — Encounter: Payer: Self-pay | Admitting: Cardiovascular Disease

## 2013-04-20 ENCOUNTER — Encounter (HOSPITAL_COMMUNITY): Payer: Self-pay

## 2013-05-12 ENCOUNTER — Ambulatory Visit (HOSPITAL_COMMUNITY)
Admission: RE | Admit: 2013-05-12 | Discharge: 2013-05-12 | Disposition: A | Discharge status: Home / Self Care | Payer: Medicare Other | Source: Ambulatory Visit | Attending: Internal Medicine | Admitting: Internal Medicine

## 2013-05-12 ENCOUNTER — Other Ambulatory Visit (HOSPITAL_COMMUNITY): Payer: Self-pay | Admitting: Cardiovascular Disease

## 2013-05-12 ENCOUNTER — Encounter (HOSPITAL_COMMUNITY): Payer: Self-pay

## 2013-05-12 DIAGNOSIS — I714 Abdominal aortic aneurysm, without rupture, unspecified: Secondary | ICD-10-CM | POA: Insufficient documentation

## 2013-05-12 DIAGNOSIS — R0989 Other specified symptoms and signs involving the circulatory and respiratory systems: Secondary | ICD-10-CM

## 2013-05-12 NOTE — Progress Notes (Signed)
Carotid Duplex Completed. Kimmberly Wisser, RDMS, RVT  

## 2013-05-12 NOTE — Progress Notes (Signed)
Aorta Duplex Completed. Kannon Granderson, RDMS, RVT  

## 2013-05-25 ENCOUNTER — Other Ambulatory Visit: Payer: Self-pay | Admitting: Neurosurgery

## 2013-05-25 DIAGNOSIS — M47817 Spondylosis without myelopathy or radiculopathy, lumbosacral region: Secondary | ICD-10-CM

## 2013-05-27 ENCOUNTER — Ambulatory Visit
Admission: RE | Admit: 2013-05-27 | Discharge: 2013-05-27 | Disposition: A | Discharge status: Home / Self Care | Payer: Medicare Other | Source: Ambulatory Visit | Attending: Neurosurgery | Admitting: Neurosurgery

## 2013-05-27 DIAGNOSIS — M47817 Spondylosis without myelopathy or radiculopathy, lumbosacral region: Secondary | ICD-10-CM

## 2013-06-17 ENCOUNTER — Other Ambulatory Visit: Payer: Self-pay | Admitting: Neurosurgery

## 2013-06-17 DIAGNOSIS — M47816 Spondylosis without myelopathy or radiculopathy, lumbar region: Secondary | ICD-10-CM

## 2013-06-23 ENCOUNTER — Other Ambulatory Visit: Payer: Self-pay | Admitting: Neurosurgery

## 2013-06-23 ENCOUNTER — Ambulatory Visit
Admission: RE | Admit: 2013-06-23 | Discharge: 2013-06-23 | Disposition: A | Discharge status: Home / Self Care | Payer: Medicare Other | Source: Ambulatory Visit | Attending: Neurosurgery | Admitting: Neurosurgery

## 2013-06-23 VITALS — BP 161/81 | HR 63

## 2013-06-23 DIAGNOSIS — M47816 Spondylosis without myelopathy or radiculopathy, lumbar region: Secondary | ICD-10-CM

## 2013-06-23 MED ORDER — IOHEXOL 180 MG/ML  SOLN
1.0000 mL | Freq: Once | INTRAMUSCULAR | Status: AC | PRN
  Administered 2013-06-23: 1 mL via INTRA_ARTICULAR

## 2013-06-23 MED ORDER — METHYLPREDNISOLONE ACETATE 40 MG/ML INJ SUSP (RADIOLOG
120.0000 mg | Freq: Once | INTRAMUSCULAR | Status: AC
  Administered 2013-06-23: 120 mg via INTRA_ARTICULAR

## 2014-06-12 ENCOUNTER — Other Ambulatory Visit: Payer: Self-pay | Admitting: Dermatology

## 2014-11-03 ENCOUNTER — Other Ambulatory Visit: Payer: Self-pay | Admitting: Endocrinology

## 2014-11-03 DIAGNOSIS — R222 Localized swelling, mass and lump, trunk: Secondary | ICD-10-CM

## 2014-11-06 ENCOUNTER — Ambulatory Visit
Admission: RE | Admit: 2014-11-06 | Discharge: 2014-11-06 | Disposition: A | Discharge status: Home / Self Care | Payer: Medicare Other | Source: Ambulatory Visit | Attending: Endocrinology | Admitting: Endocrinology

## 2014-11-06 ENCOUNTER — Other Ambulatory Visit: Payer: Self-pay | Admitting: Endocrinology

## 2014-11-06 DIAGNOSIS — R222 Localized swelling, mass and lump, trunk: Secondary | ICD-10-CM

## 2014-11-27 DIAGNOSIS — H35372 Puckering of macula, left eye: Secondary | ICD-10-CM | POA: Diagnosis not present

## 2014-11-27 DIAGNOSIS — H43812 Vitreous degeneration, left eye: Secondary | ICD-10-CM | POA: Diagnosis not present

## 2015-01-15 DIAGNOSIS — E789 Disorder of lipoprotein metabolism, unspecified: Secondary | ICD-10-CM | POA: Diagnosis not present

## 2015-01-15 DIAGNOSIS — N400 Enlarged prostate without lower urinary tract symptoms: Secondary | ICD-10-CM | POA: Diagnosis not present

## 2015-01-15 DIAGNOSIS — R42 Dizziness and giddiness: Secondary | ICD-10-CM | POA: Diagnosis not present

## 2015-01-15 DIAGNOSIS — M549 Dorsalgia, unspecified: Secondary | ICD-10-CM | POA: Diagnosis not present

## 2015-04-17 DIAGNOSIS — Z0100 Encounter for examination of eyes and vision without abnormal findings: Secondary | ICD-10-CM | POA: Diagnosis not present

## 2015-05-22 DIAGNOSIS — L821 Other seborrheic keratosis: Secondary | ICD-10-CM | POA: Diagnosis not present

## 2015-05-22 DIAGNOSIS — D1801 Hemangioma of skin and subcutaneous tissue: Secondary | ICD-10-CM | POA: Diagnosis not present

## 2015-05-22 DIAGNOSIS — D692 Other nonthrombocytopenic purpura: Secondary | ICD-10-CM | POA: Diagnosis not present

## 2015-05-22 DIAGNOSIS — L570 Actinic keratosis: Secondary | ICD-10-CM | POA: Diagnosis not present

## 2015-05-22 DIAGNOSIS — Z85828 Personal history of other malignant neoplasm of skin: Secondary | ICD-10-CM | POA: Diagnosis not present

## 2015-05-22 DIAGNOSIS — L723 Sebaceous cyst: Secondary | ICD-10-CM | POA: Diagnosis not present

## 2015-07-04 DIAGNOSIS — Z0000 Encounter for general adult medical examination without abnormal findings: Secondary | ICD-10-CM | POA: Diagnosis not present

## 2015-07-04 DIAGNOSIS — Z125 Encounter for screening for malignant neoplasm of prostate: Secondary | ICD-10-CM | POA: Diagnosis not present

## 2015-07-04 DIAGNOSIS — E780 Pure hypercholesterolemia: Secondary | ICD-10-CM | POA: Diagnosis not present

## 2015-07-11 DIAGNOSIS — N400 Enlarged prostate without lower urinary tract symptoms: Secondary | ICD-10-CM | POA: Diagnosis not present

## 2015-07-11 DIAGNOSIS — I10 Essential (primary) hypertension: Secondary | ICD-10-CM | POA: Diagnosis not present

## 2015-07-11 DIAGNOSIS — E789 Disorder of lipoprotein metabolism, unspecified: Secondary | ICD-10-CM | POA: Diagnosis not present

## 2015-07-12 DIAGNOSIS — M47816 Spondylosis without myelopathy or radiculopathy, lumbar region: Secondary | ICD-10-CM | POA: Diagnosis not present

## 2015-08-14 DIAGNOSIS — N400 Enlarged prostate without lower urinary tract symptoms: Secondary | ICD-10-CM | POA: Diagnosis not present

## 2015-08-14 DIAGNOSIS — I10 Essential (primary) hypertension: Secondary | ICD-10-CM | POA: Diagnosis not present

## 2015-08-17 DIAGNOSIS — N403 Nodular prostate with lower urinary tract symptoms: Secondary | ICD-10-CM | POA: Diagnosis not present

## 2015-08-17 DIAGNOSIS — R972 Elevated prostate specific antigen [PSA]: Secondary | ICD-10-CM | POA: Diagnosis not present

## 2015-09-25 DIAGNOSIS — N400 Enlarged prostate without lower urinary tract symptoms: Secondary | ICD-10-CM | POA: Diagnosis not present

## 2015-09-25 DIAGNOSIS — E109 Type 1 diabetes mellitus without complications: Secondary | ICD-10-CM | POA: Diagnosis not present

## 2015-09-25 DIAGNOSIS — R42 Dizziness and giddiness: Secondary | ICD-10-CM | POA: Diagnosis not present

## 2015-10-01 DIAGNOSIS — H9202 Otalgia, left ear: Secondary | ICD-10-CM | POA: Diagnosis not present

## 2015-10-01 DIAGNOSIS — R42 Dizziness and giddiness: Secondary | ICD-10-CM | POA: Diagnosis not present

## 2015-11-27 DIAGNOSIS — N400 Enlarged prostate without lower urinary tract symptoms: Secondary | ICD-10-CM | POA: Diagnosis not present

## 2015-11-27 DIAGNOSIS — Z79899 Other long term (current) drug therapy: Secondary | ICD-10-CM | POA: Diagnosis not present

## 2015-11-27 DIAGNOSIS — I10 Essential (primary) hypertension: Secondary | ICD-10-CM | POA: Diagnosis not present

## 2015-12-03 DIAGNOSIS — N400 Enlarged prostate without lower urinary tract symptoms: Secondary | ICD-10-CM | POA: Diagnosis not present

## 2015-12-03 DIAGNOSIS — E789 Disorder of lipoprotein metabolism, unspecified: Secondary | ICD-10-CM | POA: Diagnosis not present

## 2016-02-11 DIAGNOSIS — R972 Elevated prostate specific antigen [PSA]: Secondary | ICD-10-CM | POA: Diagnosis not present

## 2016-02-11 DIAGNOSIS — N401 Enlarged prostate with lower urinary tract symptoms: Secondary | ICD-10-CM | POA: Diagnosis not present

## 2016-02-25 DIAGNOSIS — E789 Disorder of lipoprotein metabolism, unspecified: Secondary | ICD-10-CM | POA: Diagnosis not present

## 2016-02-25 DIAGNOSIS — I10 Essential (primary) hypertension: Secondary | ICD-10-CM | POA: Diagnosis not present

## 2016-03-03 DIAGNOSIS — R972 Elevated prostate specific antigen [PSA]: Secondary | ICD-10-CM | POA: Diagnosis not present

## 2016-03-04 DIAGNOSIS — E789 Disorder of lipoprotein metabolism, unspecified: Secondary | ICD-10-CM | POA: Diagnosis not present

## 2016-03-04 DIAGNOSIS — F419 Anxiety disorder, unspecified: Secondary | ICD-10-CM | POA: Diagnosis not present

## 2016-03-04 DIAGNOSIS — N400 Enlarged prostate without lower urinary tract symptoms: Secondary | ICD-10-CM | POA: Diagnosis not present

## 2016-04-23 DIAGNOSIS — M47816 Spondylosis without myelopathy or radiculopathy, lumbar region: Secondary | ICD-10-CM | POA: Diagnosis not present

## 2016-08-13 DIAGNOSIS — H01001 Unspecified blepharitis right upper eyelid: Secondary | ICD-10-CM | POA: Diagnosis not present

## 2016-08-13 DIAGNOSIS — H35373 Puckering of macula, bilateral: Secondary | ICD-10-CM | POA: Diagnosis not present

## 2016-08-13 DIAGNOSIS — H01002 Unspecified blepharitis right lower eyelid: Secondary | ICD-10-CM | POA: Diagnosis not present

## 2016-08-13 DIAGNOSIS — H26491 Other secondary cataract, right eye: Secondary | ICD-10-CM | POA: Diagnosis not present

## 2016-08-13 DIAGNOSIS — H5213 Myopia, bilateral: Secondary | ICD-10-CM | POA: Diagnosis not present

## 2016-11-06 DIAGNOSIS — E789 Disorder of lipoprotein metabolism, unspecified: Secondary | ICD-10-CM | POA: Diagnosis not present

## 2016-11-06 DIAGNOSIS — E7800 Pure hypercholesterolemia, unspecified: Secondary | ICD-10-CM | POA: Diagnosis not present

## 2016-11-06 DIAGNOSIS — I10 Essential (primary) hypertension: Secondary | ICD-10-CM | POA: Diagnosis not present

## 2016-11-13 DIAGNOSIS — K5901 Slow transit constipation: Secondary | ICD-10-CM | POA: Diagnosis not present

## 2016-11-13 DIAGNOSIS — G629 Polyneuropathy, unspecified: Secondary | ICD-10-CM | POA: Diagnosis not present

## 2017-02-06 DIAGNOSIS — E789 Disorder of lipoprotein metabolism, unspecified: Secondary | ICD-10-CM | POA: Diagnosis not present

## 2017-02-06 DIAGNOSIS — I10 Essential (primary) hypertension: Secondary | ICD-10-CM | POA: Diagnosis not present

## 2017-02-11 DIAGNOSIS — E789 Disorder of lipoprotein metabolism, unspecified: Secondary | ICD-10-CM | POA: Diagnosis not present

## 2017-02-11 DIAGNOSIS — M549 Dorsalgia, unspecified: Secondary | ICD-10-CM | POA: Diagnosis not present

## 2017-02-11 DIAGNOSIS — N400 Enlarged prostate without lower urinary tract symptoms: Secondary | ICD-10-CM | POA: Diagnosis not present

## 2017-03-03 DIAGNOSIS — Z85828 Personal history of other malignant neoplasm of skin: Secondary | ICD-10-CM | POA: Diagnosis not present

## 2017-03-03 DIAGNOSIS — D044 Carcinoma in situ of skin of scalp and neck: Secondary | ICD-10-CM | POA: Diagnosis not present

## 2017-03-03 DIAGNOSIS — L570 Actinic keratosis: Secondary | ICD-10-CM | POA: Diagnosis not present

## 2017-03-03 DIAGNOSIS — N400 Enlarged prostate without lower urinary tract symptoms: Secondary | ICD-10-CM | POA: Diagnosis not present

## 2017-03-03 DIAGNOSIS — I10 Essential (primary) hypertension: Secondary | ICD-10-CM | POA: Diagnosis not present

## 2017-03-03 DIAGNOSIS — L820 Inflamed seborrheic keratosis: Secondary | ICD-10-CM | POA: Diagnosis not present

## 2017-06-03 DIAGNOSIS — R0989 Other specified symptoms and signs involving the circulatory and respiratory systems: Secondary | ICD-10-CM | POA: Diagnosis not present

## 2017-06-24 DIAGNOSIS — N400 Enlarged prostate without lower urinary tract symptoms: Secondary | ICD-10-CM | POA: Diagnosis not present

## 2017-06-24 DIAGNOSIS — R42 Dizziness and giddiness: Secondary | ICD-10-CM | POA: Diagnosis not present

## 2017-06-24 DIAGNOSIS — E7800 Pure hypercholesterolemia, unspecified: Secondary | ICD-10-CM | POA: Diagnosis not present

## 2017-06-24 DIAGNOSIS — H9202 Otalgia, left ear: Secondary | ICD-10-CM | POA: Diagnosis not present

## 2017-07-17 DIAGNOSIS — H9202 Otalgia, left ear: Secondary | ICD-10-CM | POA: Diagnosis not present

## 2017-07-17 DIAGNOSIS — M270 Developmental disorders of jaws: Secondary | ICD-10-CM | POA: Diagnosis not present

## 2017-07-17 DIAGNOSIS — R42 Dizziness and giddiness: Secondary | ICD-10-CM | POA: Diagnosis not present

## 2017-07-17 DIAGNOSIS — H6122 Impacted cerumen, left ear: Secondary | ICD-10-CM | POA: Diagnosis not present

## 2017-07-17 DIAGNOSIS — J343 Hypertrophy of nasal turbinates: Secondary | ICD-10-CM | POA: Diagnosis not present

## 2017-08-19 ENCOUNTER — Telehealth: Payer: Self-pay | Admitting: Cardiology

## 2017-08-19 NOTE — Telephone Encounter (Signed)
Received records from [facility] for appointment on 09/23/17 with Dr Ellyn Hack.  Records put with Dr Allison Quarry schedule for 09/23/17. lp

## 2017-08-21 DIAGNOSIS — H8113 Benign paroxysmal vertigo, bilateral: Secondary | ICD-10-CM | POA: Diagnosis not present

## 2017-09-23 ENCOUNTER — Ambulatory Visit: Payer: Self-pay | Admitting: Cardiology

## 2017-10-19 DIAGNOSIS — Z0000 Encounter for general adult medical examination without abnormal findings: Secondary | ICD-10-CM | POA: Diagnosis not present

## 2017-10-19 DIAGNOSIS — Z1212 Encounter for screening for malignant neoplasm of rectum: Secondary | ICD-10-CM | POA: Diagnosis not present

## 2017-10-19 DIAGNOSIS — E7800 Pure hypercholesterolemia, unspecified: Secondary | ICD-10-CM | POA: Diagnosis not present

## 2017-10-19 DIAGNOSIS — I2510 Atherosclerotic heart disease of native coronary artery without angina pectoris: Secondary | ICD-10-CM | POA: Diagnosis not present

## 2017-10-22 ENCOUNTER — Encounter: Payer: Self-pay | Admitting: Internal Medicine

## 2017-11-18 DIAGNOSIS — C4492 Squamous cell carcinoma of skin, unspecified: Secondary | ICD-10-CM | POA: Diagnosis not present

## 2017-11-18 DIAGNOSIS — C44629 Squamous cell carcinoma of skin of left upper limb, including shoulder: Secondary | ICD-10-CM | POA: Diagnosis not present

## 2017-12-03 DIAGNOSIS — D0462 Carcinoma in situ of skin of left upper limb, including shoulder: Secondary | ICD-10-CM | POA: Diagnosis not present

## 2017-12-03 DIAGNOSIS — C44629 Squamous cell carcinoma of skin of left upper limb, including shoulder: Secondary | ICD-10-CM | POA: Diagnosis not present

## 2017-12-17 ENCOUNTER — Other Ambulatory Visit: Payer: Self-pay

## 2017-12-17 ENCOUNTER — Emergency Department (HOSPITAL_COMMUNITY): Payer: Medicare Other

## 2017-12-17 ENCOUNTER — Emergency Department (HOSPITAL_COMMUNITY)
Admission: EM | Admit: 2017-12-17 | Discharge: 2017-12-17 | Disposition: A | Discharge status: Home / Self Care | Payer: Medicare Other | Attending: Emergency Medicine | Admitting: Emergency Medicine

## 2017-12-17 ENCOUNTER — Encounter (HOSPITAL_COMMUNITY): Payer: Self-pay | Admitting: Emergency Medicine

## 2017-12-17 DIAGNOSIS — Y939 Activity, unspecified: Secondary | ICD-10-CM | POA: Insufficient documentation

## 2017-12-17 DIAGNOSIS — S60511A Abrasion of right hand, initial encounter: Secondary | ICD-10-CM | POA: Insufficient documentation

## 2017-12-17 DIAGNOSIS — M542 Cervicalgia: Secondary | ICD-10-CM | POA: Insufficient documentation

## 2017-12-17 DIAGNOSIS — M546 Pain in thoracic spine: Secondary | ICD-10-CM | POA: Diagnosis not present

## 2017-12-17 DIAGNOSIS — Y999 Unspecified external cause status: Secondary | ICD-10-CM | POA: Insufficient documentation

## 2017-12-17 DIAGNOSIS — S199XXA Unspecified injury of neck, initial encounter: Secondary | ICD-10-CM | POA: Diagnosis not present

## 2017-12-17 DIAGNOSIS — Y929 Unspecified place or not applicable: Secondary | ICD-10-CM | POA: Insufficient documentation

## 2017-12-17 DIAGNOSIS — Z041 Encounter for examination and observation following transport accident: Secondary | ICD-10-CM | POA: Diagnosis present

## 2017-12-17 DIAGNOSIS — S40811A Abrasion of right upper arm, initial encounter: Secondary | ICD-10-CM | POA: Diagnosis not present

## 2017-12-17 DIAGNOSIS — S80211A Abrasion, right knee, initial encounter: Secondary | ICD-10-CM | POA: Insufficient documentation

## 2017-12-17 DIAGNOSIS — S299XXA Unspecified injury of thorax, initial encounter: Secondary | ICD-10-CM | POA: Diagnosis not present

## 2017-12-17 MED ORDER — DICLOFENAC EPOLAMINE 1.3 % TD PTCH
1.0000 | MEDICATED_PATCH | Freq: Two times a day (BID) | TRANSDERMAL | Status: DC
  Administered 2017-12-17: 1 via TRANSDERMAL
  Filled 2017-12-17: qty 1

## 2017-12-17 MED ORDER — DICLOFENAC EPOLAMINE 1.3 % TD PTCH: 1.0000 | MEDICATED_PATCH | Freq: Two times a day (BID) | TRANSDERMAL | 0 refills | Status: AC

## 2017-12-17 MED ORDER — BACITRACIN ZINC 500 UNIT/GM EX OINT
TOPICAL_OINTMENT | CUTANEOUS | Status: AC
  Administered 2017-12-17: 1
  Filled 2017-12-17: qty 0.9
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
## 2018-06-03 DIAGNOSIS — R5383 Other fatigue: Secondary | ICD-10-CM | POA: Diagnosis not present

## 2018-08-25 ENCOUNTER — Emergency Department (HOSPITAL_COMMUNITY)
Admission: EM | Admit: 2018-08-25 | Discharge: 2018-08-25 | Disposition: A | Discharge status: Home / Self Care | Payer: Medicare Other | Attending: Emergency Medicine | Admitting: Emergency Medicine

## 2018-08-25 ENCOUNTER — Other Ambulatory Visit: Payer: Self-pay

## 2018-08-25 ENCOUNTER — Encounter (HOSPITAL_COMMUNITY): Payer: Self-pay | Admitting: *Deleted

## 2018-08-25 DIAGNOSIS — K5900 Constipation, unspecified: Secondary | ICD-10-CM | POA: Diagnosis not present

## 2018-08-25 DIAGNOSIS — Z79899 Other long term (current) drug therapy: Secondary | ICD-10-CM | POA: Diagnosis not present

## 2018-08-25 DIAGNOSIS — K625 Hemorrhage of anus and rectum: Secondary | ICD-10-CM | POA: Diagnosis not present

## 2018-08-25 LAB — URINALYSIS, ROUTINE W REFLEX MICROSCOPIC
BILIRUBIN URINE: NEGATIVE
Glucose, UA: NEGATIVE mg/dL
Hgb urine dipstick: NEGATIVE
KETONES UR: NEGATIVE mg/dL
Leukocytes, UA: NEGATIVE
NITRITE: NEGATIVE
PROTEIN: NEGATIVE mg/dL
Specific Gravity, Urine: 1.02 (ref 1.005–1.030)
pH: 5.5 (ref 5.0–8.0)

## 2018-08-25 LAB — CBC
HCT: 35.9 % — ABNORMAL LOW (ref 39.0–52.0)
HEMOGLOBIN: 11.8 g/dL — AB (ref 13.0–17.0)
MCH: 34 pg (ref 26.0–34.0)
MCHC: 32.9 g/dL (ref 30.0–36.0)
MCV: 103.5 fL — ABNORMAL HIGH (ref 80.0–100.0)
Platelets: 144 10*3/uL — ABNORMAL LOW (ref 150–400)
RBC: 3.47 MIL/uL — AB (ref 4.22–5.81)
RDW: 13.7 % (ref 11.5–15.5)
WBC: 6.5 10*3/uL (ref 4.0–10.5)
nRBC: 0 % (ref 0.0–0.2)

## 2018-08-25 LAB — COMPREHENSIVE METABOLIC PANEL
ALBUMIN: 3.5 g/dL (ref 3.5–5.0)
ALT: 18 U/L (ref 0–44)
ANION GAP: 9 (ref 5–15)
AST: 22 U/L (ref 15–41)
Alkaline Phosphatase: 58 U/L (ref 38–126)
BILIRUBIN TOTAL: 0.9 mg/dL (ref 0.3–1.2)
BUN: 10 mg/dL (ref 8–23)
CO2: 24 mmol/L (ref 22–32)
Calcium: 8.9 mg/dL (ref 8.9–10.3)
Chloride: 106 mmol/L (ref 98–111)
Creatinine, Ser: 1.09 mg/dL (ref 0.61–1.24)
GFR calc Af Amer: >60 mL/min (ref >60)
GFR calc non Af Amer: 58 mL/min — ABNORMAL LOW (ref >60)
GLUCOSE: 116 mg/dL — AB (ref 70–99)
POTASSIUM: 3.9 mmol/L (ref 3.5–5.1)
SODIUM: 139 mmol/L (ref 135–145)
TOTAL PROTEIN: 6.4 g/dL — AB (ref 6.5–8.1)

## 2018-08-25 LAB — TYPE AND SCREEN
ABO/RH(D): A NEG
ANTIBODY SCREEN: NEGATIVE

## 2018-08-25 NOTE — Discharge Instructions (Addendum)
Call gastroenterology today to schedule an appointment. Avoid constipation, take home Miralax twice daily. Return to the Ed if you experience severe worsening of your symptoms, rectal pain, loss of consciousness.

## 2018-08-25 NOTE — ED Triage Notes (Signed)
Pt in c/o possible rectal vs scrotal bleeding, pt believes the bleeding is coming from his scrotal area, was sitting on his toilet attempting to have a bowel movement and noted the toilet was filled with blood, denies pain at this time, does report he was straining to have his bowel movement, possibly blood in his urine

## 2018-08-25 NOTE — ED Provider Notes (Signed)
[facility] EMERGENCY DEPARTMENT Provider Note   CSN: [id number] Arrival date & time: 08/25/18  2633     History   Chief Complaint Chief Complaint  Patient presents with  . Rectal Bleeding    HPI BOWE SIDOR is a 84 y.o. male with a pmhx of AAA, who presented to the ED today complaining of rectal bleeding. Pt reports that he has chronic constipation. He was straining to have a BM this AM and notcied BRB in the toilet. He was not sure if this was oming from his bottom or genitals. No prior hx of GI bleed. Denies any rectal pain, abdominal pain, genital/scrotal pain. He has subsequently urinated and it was clear/yellow. He has never had colonoscopy before. No known family hx of GI malignancy. No NSAID use, very rare Aleve intake only a few times a year. No anticoagulation.   HPI  History reviewed. No pertinent past medical history.  There are no active problems to display for this patient.   Past Surgical History:  Procedure Laterality Date  . BACK SURGERY          Home Medications    Prior to Admission medications   Medication Sig Start Date End Date Taking? Authorizing Provider  Ascorbic Acid (VITAMIN C PO) Take 1 tablet by mouth daily.    [provider]  LORazepam (ATIVAN) 1 MG tablet Take 1 mg by mouth every 8 (eight) hours as needed for anxiety.    [provider]  omeprazole (PRILOSEC) 20 MG capsule Take 20 mg by mouth 2 (two) times daily as needed.    [provider]  tamsulosin (FLOMAX) 0.4 MG CAPS Take 0.4 mg by mouth daily.    [provider]    Family History History reviewed. No pertinent family history.  Social History Social History   Tobacco Use  . Smoking status: Not on file  Substance Use Topics  . Alcohol use: Not on file  . Drug use: Not on file     Allergies   Patient has no known allergies.   Review of Systems Review of Systems  All other systems reviewed and are  negative.    Physical Exam Updated Vital Signs BP (!) 142/63 (BP Location: Right Arm)   Pulse 68   Temp 98 F (36.7 C) (Oral)   Resp 16   SpO2 96%   Physical Exam  Constitutional: He is oriented to person, place, and time. He appears well-developed and well-nourished. No distress.  HENT:  Head: Normocephalic and atraumatic.  Mouth/Throat: No oropharyngeal exudate.  Eyes: Pupils are equal, round, and reactive to light. Conjunctivae and EOM are normal. Right eye exhibits no discharge. Left eye exhibits no discharge. No scleral icterus.  Cardiovascular: Normal rate, regular rhythm, normal heart sounds and intact distal pulses. Exam reveals no gallop and no friction rub.  No murmur heard. Pulmonary/Chest: Effort normal and breath sounds normal. No respiratory distress. He has no wheezes. He has no rales. He exhibits no tenderness.  Abdominal: Soft. He exhibits no distension. There is no tenderness. There is no guarding.  Genitourinary: Rectum normal and penis normal.  Genitourinary Comments: No gross blood on external rectal exam, no obvious external hemorrhoids.  Scrotum atraumatic, no bleeding noted from this area.   Musculoskeletal: Normal range of motion. He exhibits no edema.  Neurological: He is alert and oriented to person, place, and time.  Skin: Skin is warm and dry. No rash noted. He is not diaphoretic. No erythema. No  pallor.  Psychiatric: He has a normal mood and affect. His behavior is normal.  Nursing note and vitals reviewed.    ED Treatments / Results  Labs (all labs ordered are listed, but only abnormal results are displayed) Labs Reviewed  COMPREHENSIVE METABOLIC PANEL - Abnormal; Notable for the following components:      Result Value   Glucose, Bld 116 (*)    Total Protein 6.4 (*)    GFR calc non Af Amer 58 (*)    All other components within normal limits  CBC - Abnormal; Notable for the following components:   RBC 3.47 (*)    Hemoglobin 11.8 (*)    HCT  35.9 (*)    MCV 103.5 (*)    Platelets 144 (*)    All other components within normal limits  URINALYSIS, ROUTINE W REFLEX MICROSCOPIC  POC OCCULT BLOOD, ED  TYPE AND SCREEN    EKG None  Radiology No results found.  Procedures Procedures (including critical care time)  Medications Ordered in ED Medications - No data to display   Initial Impression / Assessment and Plan / ED Course  I have reviewed the triage vital signs and the nursing notes.  Pertinent labs & imaging results that were available during my care of the patient were reviewed by me and considered in my medical decision making (see chart for details).     Very functional 84 y.o M presents with acute osnet BRBPR in the setting of straining to have a BM today. Bleeding was painless. No prior hx of GIB. No anticoagulation. NO obvious blood on external rectal exam. Hgb stable at 11.8. Possibly internal hemorrhoid bleed. He has never had colonoscopy before. Feel that pt is stable for discharge with close GI follow up. WIll discharge home with bid Miralax to avoid constipation/straining. Return precautions outlined in patient discharge instructions.   Patient was discussed with and seen by Dr. Venora Maples who agrees with the treatment plan.    Final Clinical Impressions(s) / ED Diagnoses   Final diagnoses:  None    ED Discharge Orders    None       Carlos Levering, PA-C 08/25/18 1342    Jola Schmidt, MD 08/25/18 1553

## 2018-08-30 DIAGNOSIS — K625 Hemorrhage of anus and rectum: Secondary | ICD-10-CM | POA: Diagnosis not present

## 2018-08-31 DIAGNOSIS — D649 Anemia, unspecified: Secondary | ICD-10-CM | POA: Diagnosis not present

## 2018-09-08 ENCOUNTER — Other Ambulatory Visit: Payer: Self-pay | Admitting: Gastroenterology

## 2018-09-08 DIAGNOSIS — K625 Hemorrhage of anus and rectum: Secondary | ICD-10-CM | POA: Diagnosis not present

## 2018-09-08 DIAGNOSIS — K219 Gastro-esophageal reflux disease without esophagitis: Secondary | ICD-10-CM | POA: Diagnosis not present

## 2018-09-08 DIAGNOSIS — I2510 Atherosclerotic heart disease of native coronary artery without angina pectoris: Secondary | ICD-10-CM | POA: Diagnosis not present

## 2018-09-13 ENCOUNTER — Emergency Department (HOSPITAL_COMMUNITY)
Admission: EM | Admit: 2018-09-13 | Discharge: 2018-09-13 | Disposition: A | Discharge status: Home / Self Care | Payer: Medicare Other | Attending: Emergency Medicine | Admitting: Emergency Medicine

## 2018-09-13 ENCOUNTER — Emergency Department (HOSPITAL_BASED_OUTPATIENT_CLINIC_OR_DEPARTMENT_OTHER): Payer: Medicare Other

## 2018-09-13 ENCOUNTER — Encounter (HOSPITAL_COMMUNITY): Payer: Self-pay | Admitting: *Deleted

## 2018-09-13 DIAGNOSIS — R000 Tachycardia, unspecified: Secondary | ICD-10-CM | POA: Diagnosis not present

## 2018-09-13 DIAGNOSIS — I480 Paroxysmal atrial fibrillation: Secondary | ICD-10-CM | POA: Diagnosis not present

## 2018-09-13 DIAGNOSIS — R21 Rash and other nonspecific skin eruption: Secondary | ICD-10-CM | POA: Diagnosis not present

## 2018-09-13 DIAGNOSIS — I2510 Atherosclerotic heart disease of native coronary artery without angina pectoris: Secondary | ICD-10-CM | POA: Diagnosis not present

## 2018-09-13 DIAGNOSIS — R609 Edema, unspecified: Secondary | ICD-10-CM | POA: Diagnosis not present

## 2018-09-13 DIAGNOSIS — Z79899 Other long term (current) drug therapy: Secondary | ICD-10-CM | POA: Diagnosis not present

## 2018-09-13 DIAGNOSIS — I4891 Unspecified atrial fibrillation: Secondary | ICD-10-CM | POA: Diagnosis not present

## 2018-09-13 DIAGNOSIS — M7989 Other specified soft tissue disorders: Secondary | ICD-10-CM | POA: Insufficient documentation

## 2018-09-13 DIAGNOSIS — R600 Localized edema: Secondary | ICD-10-CM | POA: Diagnosis not present

## 2018-09-13 DIAGNOSIS — I10 Essential (primary) hypertension: Secondary | ICD-10-CM | POA: Diagnosis not present

## 2018-09-13 DIAGNOSIS — R9431 Abnormal electrocardiogram [ECG] [EKG]: Secondary | ICD-10-CM | POA: Diagnosis not present

## 2018-09-13 DIAGNOSIS — R2241 Localized swelling, mass and lump, right lower limb: Secondary | ICD-10-CM | POA: Diagnosis present

## 2018-09-13 HISTORY — DX: Hyperlipidemia, unspecified: E78.5

## 2018-09-13 LAB — BASIC METABOLIC PANEL
ANION GAP: 9 (ref 5–15)
BUN: 8 mg/dL (ref 8–23)
CHLORIDE: 108 mmol/L (ref 98–111)
CO2: 23 mmol/L (ref 22–32)
Calcium: 9.2 mg/dL (ref 8.9–10.3)
Creatinine, Ser: 0.98 mg/dL (ref 0.61–1.24)
Glucose, Bld: 120 mg/dL — ABNORMAL HIGH (ref 70–99)
POTASSIUM: 3.7 mmol/L (ref 3.5–5.1)
SODIUM: 140 mmol/L (ref 135–145)

## 2018-09-13 LAB — CBC
HEMATOCRIT: 36.9 % — AB (ref 39.0–52.0)
HEMOGLOBIN: 12 g/dL — AB (ref 13.0–17.0)
MCH: 33.2 pg (ref 26.0–34.0)
MCHC: 32.5 g/dL (ref 30.0–36.0)
MCV: 102.2 fL — ABNORMAL HIGH (ref 80.0–100.0)
NRBC: 0 % (ref 0.0–0.2)
Platelets: 146 10*3/uL — ABNORMAL LOW (ref 150–400)
RBC: 3.61 MIL/uL — ABNORMAL LOW (ref 4.22–5.81)
RDW: 13.9 % (ref 11.5–15.5)
WBC: 6.7 10*3/uL (ref 4.0–10.5)

## 2018-09-13 NOTE — ED Provider Notes (Signed)
Patient placed in Quick Look pathway, seen and evaluated   Chief Complaint: Right leg swelling  HPI:   Patient reports that approximately 7 days ago he had a colonoscopy that was normal however since then he has had right-sided leg swelling.  He denies any pain.  His PCP, where he went today, obtained an EKG and they were concerned to have ST elevation.    ROS: No chest pain, shortness of breath (one)  Physical Exam:   Gen: No distress  Neuro: Awake and Alert  Skin: Warm    Focused Exam: Right leg distal pulses are intact.  Mild edema posterior calf proximally.   Initiation of care has begun. The patient has been counseled on the process, plan, and necessity for staying for the completion/evaluation, and the remainder of the medical screening examination    Ollen Gross 09/13/18 [place], [place], DO 09/13/18 [phone]

## 2018-09-13 NOTE — ED Notes (Signed)
ED Provider at bedside. 

## 2018-09-13 NOTE — ED Notes (Signed)
Patient verbalizes understanding of discharge instructions. Opportunity for questioning and answers were provided. Armband removed by staff, pt discharged from ED. Pt wheeled to lobby with family.  

## 2018-09-13 NOTE — ED Provider Notes (Signed)
[facility] EMERGENCY DEPARTMENT Provider Note   CSN: [id number] Arrival date & time: 09/13/18  1413     History   Chief Complaint Chief Complaint  Patient presents with  . Leg Swelling    HPI Todd Wilkinson is a 84 y.o. male.  Patient sent in by his primary care doctor Dr. Concha Pyo. Patient with a history of right leg swelling probably for about a month.  Primary care doctor got concerned about possible DVT.  Patient without any chest pain or any shortness of breath.  No pain to the leg.  No history of injury.  Patient went to see his primary care doctor because he was concerned about some irritation of his scrotal area.  But due to the concerns for the blood clot that was never evaluated.     Past Medical History:  Diagnosis Date  . Hyperlipidemia     There are no active problems to display for this patient.   Past Surgical History:  Procedure Laterality Date  . BACK SURGERY          Home Medications    Prior to Admission medications   Medication Sig Start Date End Date Taking? Authorizing Provider  Ascorbic Acid (VITAMIN C PO) Take 1 tablet by mouth daily.    [provider]  LORazepam (ATIVAN) 1 MG tablet Take 1 mg by mouth every 8 (eight) hours as needed for anxiety.    [provider]  omeprazole (PRILOSEC) 20 MG capsule Take 20 mg by mouth 2 (two) times daily as needed.    [provider]  tamsulosin (FLOMAX) 0.4 MG CAPS Take 0.4 mg by mouth daily.    [provider]    Family History History reviewed. No pertinent family history.  Social History Social History   Tobacco Use  . Smoking status: Never Smoker  . Smokeless tobacco: Never Used  Substance Use Topics  . Alcohol use: Not on file  . Drug use: Not on file     Allergies   Patient has no known allergies.   Review of Systems Review of Systems  Constitutional: Negative for fever.  HENT: Negative for congestion.   Eyes: Negative for  redness.  Respiratory: Negative for shortness of breath.   Cardiovascular: Positive for leg swelling. Negative for chest pain.  Gastrointestinal: Negative for abdominal pain.  Genitourinary: Positive for scrotal swelling. Negative for difficulty urinating and dysuria.  Skin: Positive for rash.  Neurological: Negative for headaches.  Hematological: Does not bruise/bleed easily.  Psychiatric/Behavioral: Negative for confusion.     Physical Exam Updated Vital Signs BP (!) 172/94   Pulse 78   Temp 97.8 F (36.6 C) (Oral)   Resp 20   SpO2 100%   Physical Exam  Constitutional: He is oriented to person, place, and time. He appears well-developed and well-nourished. No distress.  HENT:  Head: Normocephalic and atraumatic.  Mouth/Throat: Oropharynx is clear and moist.  Eyes: Pupils are equal, round, and reactive to light. Conjunctivae and EOM are normal.  Neck: Neck supple.  Cardiovascular: Normal rate, regular rhythm and normal heart sounds.  Pulmonary/Chest: Effort normal and breath sounds normal. No respiratory distress.  Abdominal: Soft. Bowel sounds are normal. There is no tenderness.  Genitourinary:  Genitourinary Comments: Circumcised.  Some mild erythema to the scrotal sac and suprapubic area.  Probably secondary to some urine leakage because the area was a little wet.  No skin breakdown.  No testicular tenderness.  No mass no hernia.  Musculoskeletal:  Normal range of motion. He exhibits edema. He exhibits no tenderness.  Significant swelling to the right lower extremity from the knee down.  No erythema nontender dorsalis pedis pulses 2+.  Good cap refill.  Good range of motion.  Left leg without any swelling.  No significant thigh swelling.  Neurological: He is alert and oriented to person, place, and time. No cranial nerve deficit or sensory deficit. He exhibits normal muscle tone. Coordination normal.  Skin: Skin is warm. No rash noted.  Nursing note and vitals  reviewed.    ED Treatments / Results  Labs (all labs ordered are listed, but only abnormal results are displayed) Labs Reviewed  BASIC METABOLIC PANEL - Abnormal; Notable for the following components:      Result Value   Glucose, Bld 120 (*)    All other components within normal limits  CBC - Abnormal; Notable for the following components:   RBC 3.61 (*)    Hemoglobin 12.0 (*)    HCT 36.9 (*)    MCV 102.2 (*)    Platelets 146 (*)    All other components within normal limits    EKG None  Radiology No results found.  Procedures Procedures (including critical care time)  Medications Ordered in ED Medications - No data to display   Initial Impression / Assessment and Plan / ED Course  I have reviewed the triage vital signs and the nursing notes.  Pertinent labs & imaging results that were available during my care of the patient were reviewed by me and considered in my medical decision making (see chart for details).     Evaluation for possible DVT in the right leg was negative.  Not exactly sure what caused the swelling. Follow back up with his primary care doctor no history of injury no evidence of any cellulitis.  No pain so I doubt he pulled a muscle.  No significant swelling in the thigh.  Patient's main concern which is the swelling to the scrotal area is probably secondary to some urine irritation.  Recommended Desitin cream as a barrier and have him follow back up with primary care doctor.  Patient stable to for discharge home.  Final Clinical Impressions(s) / ED Diagnoses   Final diagnoses:  Right leg swelling  Skin rash    ED Discharge Orders    None       Fredia Sorrow, MD 09/13/18 2247

## 2018-09-13 NOTE — Progress Notes (Signed)
*  Preliminary Results* Right lower extremity venous duplex completed. Right lower extremity is negative for deep vein thrombosis. There is no evidence of right Baker's cyst.  09/13/2018 3:19 PM  Jinny Blossom Dawna Part

## 2018-09-13 NOTE — ED Triage Notes (Signed)
Pt in via EMS c/o right leg swelling, went to PCP and sent here to r/o DVT, no distress noted, denies pain

## 2018-09-13 NOTE — Discharge Instructions (Addendum)
Work-up for the right leg swelling with negative ultrasound test no evidence of any blood clots.  Patient has some irritation in the suprapubic area and around the scrotal area probably secondary to skin irritation from some urine leakage.  No skin breakdown.  Recommend like Desitin cream.  Make an appointment to follow-up with Dr. Shelia Media for reevaluation.

## 2018-09-15 DIAGNOSIS — R600 Localized edema: Secondary | ICD-10-CM | POA: Diagnosis not present

## 2018-09-17 ENCOUNTER — Ambulatory Visit (HOSPITAL_COMMUNITY): Admit: 2018-09-17 | Payer: No Typology Code available for payment source | Admitting: Gastroenterology

## 2018-09-17 ENCOUNTER — Encounter (HOSPITAL_COMMUNITY): Payer: Self-pay

## 2018-09-17 SURGERY — COLONOSCOPY WITH PROPOFOL
Anesthesia: Monitor Anesthesia Care

## 2018-09-22 DIAGNOSIS — I2510 Atherosclerotic heart disease of native coronary artery without angina pectoris: Secondary | ICD-10-CM | POA: Diagnosis not present

## 2018-09-22 DIAGNOSIS — R600 Localized edema: Secondary | ICD-10-CM | POA: Diagnosis not present

## 2018-10-05 DIAGNOSIS — R601 Generalized edema: Secondary | ICD-10-CM | POA: Diagnosis not present

## 2018-10-07 DIAGNOSIS — Z8679 Personal history of other diseases of the circulatory system: Secondary | ICD-10-CM | POA: Diagnosis not present

## 2018-10-07 DIAGNOSIS — R601 Generalized edema: Secondary | ICD-10-CM | POA: Diagnosis not present

## 2018-10-07 DIAGNOSIS — I2510 Atherosclerotic heart disease of native coronary artery without angina pectoris: Secondary | ICD-10-CM | POA: Diagnosis not present

## 2018-10-20 DIAGNOSIS — E7800 Pure hypercholesterolemia, unspecified: Secondary | ICD-10-CM | POA: Diagnosis not present

## 2018-10-25 DIAGNOSIS — Z0000 Encounter for general adult medical examination without abnormal findings: Secondary | ICD-10-CM | POA: Diagnosis not present

## 2018-10-25 DIAGNOSIS — Z23 Encounter for immunization: Secondary | ICD-10-CM | POA: Diagnosis not present

## 2018-10-25 DIAGNOSIS — R413 Other amnesia: Secondary | ICD-10-CM | POA: Diagnosis not present

## 2018-10-25 DIAGNOSIS — E7800 Pure hypercholesterolemia, unspecified: Secondary | ICD-10-CM | POA: Diagnosis not present

## 2019-01-22 DIAGNOSIS — K4090 Unilateral inguinal hernia, without obstruction or gangrene, not specified as recurrent: Secondary | ICD-10-CM | POA: Diagnosis not present

## 2019-04-13 DIAGNOSIS — I2510 Atherosclerotic heart disease of native coronary artery without angina pectoris: Secondary | ICD-10-CM | POA: Diagnosis not present

## 2019-05-30 DIAGNOSIS — H26491 Other secondary cataract, right eye: Secondary | ICD-10-CM | POA: Diagnosis not present

## 2019-05-30 DIAGNOSIS — Z961 Presence of intraocular lens: Secondary | ICD-10-CM | POA: Diagnosis not present

## 2019-05-30 DIAGNOSIS — H35373 Puckering of macula, bilateral: Secondary | ICD-10-CM | POA: Diagnosis not present

## 2019-09-09 ENCOUNTER — Encounter (INDEPENDENT_AMBULATORY_CARE_PROVIDER_SITE_OTHER): Payer: Self-pay

## 2019-10-26 DIAGNOSIS — E7800 Pure hypercholesterolemia, unspecified: Secondary | ICD-10-CM | POA: Diagnosis not present

## 2019-10-26 DIAGNOSIS — I10 Essential (primary) hypertension: Secondary | ICD-10-CM | POA: Diagnosis not present

## 2019-10-31 DIAGNOSIS — E7800 Pure hypercholesterolemia, unspecified: Secondary | ICD-10-CM | POA: Diagnosis not present

## 2019-10-31 DIAGNOSIS — Z0000 Encounter for general adult medical examination without abnormal findings: Secondary | ICD-10-CM | POA: Diagnosis not present

## 2019-10-31 DIAGNOSIS — R413 Other amnesia: Secondary | ICD-10-CM | POA: Diagnosis not present

## 2019-10-31 DIAGNOSIS — I2510 Atherosclerotic heart disease of native coronary artery without angina pectoris: Secondary | ICD-10-CM | POA: Diagnosis not present
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
## 2019-11-28 ENCOUNTER — Ambulatory Visit: Payer: Self-pay | Admitting: Cardiology

## 2019-12-05 ENCOUNTER — Ambulatory Visit: Payer: Self-pay | Admitting: Cardiology

## 2019-12-05 NOTE — Progress Notes (Deleted)
Primary Physician/Referring:  Deland Pretty, MD  Patient ID: Todd Wilkinson, male    DOB: 1925-06-06, 86 y.o.   MRN: [id number]  No chief complaint on file.  HPI:    Todd Wilkinson  is a 86 y.o. ***  Past Medical History:  Diagnosis Date  . Hyperlipidemia    Past Surgical History:  Procedure Laterality Date  . BACK SURGERY     Social History   Tobacco Use  . Smoking status: Never Smoker  . Smokeless tobacco: Never Used  Substance Use Topics  . Alcohol use: Not on file    ROS  ***ROS Objective  There were no vitals taken for this visit.  Vitals with BMI 09/13/2018 09/13/2018 91/79/1505  Systolic 697 - -  Diastolic 94 - -  Pulse 78 75 62     ***Physical Exam Laboratory examination:   No results for input(s): NA, K, CL, CO2, GLUCOSE, BUN, CREATININE, CALCIUM, GFRNONAA, GFRAA in the last 8760 hours. CrCl cannot be calculated (Patient's most recent lab result is older than the maximum 21 days allowed.).  CMP Latest Ref Rng & Units 09/13/2018 08/25/2018 04/12/2009  Glucose 70 - 99 mg/dL 120(H) 116(H) 137(H)  BUN 8 - 23 mg/dL '8 10 14  '$ Creatinine 0.61 - 1.24 mg/dL 0.98 1.09 1.1  Sodium 135 - 145 mmol/L 140 139 142  Potassium 3.5 - 5.1 mmol/L 3.7 3.9 3.7  Chloride 98 - 111 mmol/L 108 106 108  CO2 22 - 32 mmol/L 23 24 -  Calcium 8.9 - 10.3 mg/dL 9.2 8.9 -  Total Protein 6.5 - 8.1 g/dL - 6.4(L) -  Total Bilirubin 0.3 - 1.2 mg/dL - 0.9 -  Alkaline Phos 38 - 126 U/L - 58 -  AST 15 - 41 U/L - 22 -  ALT 0 - 44 U/L - 18 -   CBC Latest Ref Rng & Units 09/13/2018 08/25/2018 04/12/2009  WBC 4.0 - 10.5 K/uL 6.7 6.5 -  Hemoglobin 13.0 - 17.0 g/dL 12.0(L) 11.8(L) 13.3  Hematocrit 39.0 - 52.0 % 36.9(L) 35.9(L) 39.0  Platelets 150 - 400 K/uL 146(L) 144(L) -   Lipid Panel  No results found for: CHOL, TRIG, HDL, CHOLHDL, VLDL, LDLCALC, LDLDIRECT HEMOGLOBIN A1C No results found for: HGBA1C, MPG TSH No results for input(s): TSH in the last 8760 hours.   Labs 10/26/2019: HB  core 0.0/HCT 34.7, platelets 102K. microcytic index.  Serum glucose 90 mg, BUN 14, creatinine 1.20, eGFR 52 mL.  CMP normal.  Total cholesterol 103, triglycerides 61, HDL 45, LDL 44.  Medications and allergies  No Known Allergies   Current Outpatient Medications  Medication Instructions  . Ascorbic Acid (VITAMIN C PO) 1 tablet, Oral, Daily  . LORazepam (ATIVAN) 1 mg, Oral, Every 8 hours PRN  . omeprazole (PRILOSEC) 20 mg, Oral, 2 times daily PRN  . tamsulosin (FLOMAX) 0.4 mg, Oral, Daily    Radiology:  No results found.  Cardiac Studies:   Lexiscan myocardial perfusion scan 07/27/2012: No evidence of ischemia, normal LVEF.  Carotid artery duplex 05/12/2013: Right ACA 0-49% stenosis, left ICA 50-69% stenosis.  Assessment     ICD-10-CM   1. Paroxysmal atrial fibrillation (HCC)  I48.0   2. Coronary artery disease involving native coronary artery of native heart without angina pectoris  I25.10   3. Bilateral carotid artery stenosis  I65.23   4. Hypercholesteremia  E78.00   5. Primary hypertension  I10     ***  ***  No orders of the defined types  were placed in this encounter.   There are no discontinued medications.   Recommendations:   ***  Adrian Prows, MD, [facility] 12/05/2019, 8:08 AM [facility]. [place] Office: [phone]

## 2019-12-26 ENCOUNTER — Encounter: Payer: Self-pay | Admitting: Cardiology

## 2019-12-26 ENCOUNTER — Other Ambulatory Visit: Payer: Self-pay

## 2019-12-26 ENCOUNTER — Ambulatory Visit: Payer: Medicare Other | Admitting: Cardiology

## 2019-12-26 VITALS — BP 168/82 | HR 69 | Temp 95.9°F | Resp 18 | Ht 69.0 in | Wt 164.3 lb

## 2019-12-26 DIAGNOSIS — I129 Hypertensive chronic kidney disease with stage 1 through stage 4 chronic kidney disease, or unspecified chronic kidney disease: Secondary | ICD-10-CM

## 2019-12-26 DIAGNOSIS — I10 Essential (primary) hypertension: Secondary | ICD-10-CM

## 2019-12-26 DIAGNOSIS — N1831 Chronic kidney disease, stage 3a: Secondary | ICD-10-CM

## 2019-12-26 DIAGNOSIS — I482 Chronic atrial fibrillation, unspecified: Secondary | ICD-10-CM

## 2019-12-26 MED ORDER — LOSARTAN POTASSIUM 50 MG PO TABS: 50.0000 mg | ORAL_TABLET | Freq: Every day | ORAL | 2 refills | Status: DC

## 2019-12-26 MED ORDER — RIVAROXABAN 15 MG PO TABS: 15.0000 mg | ORAL_TABLET | Freq: Every day | ORAL | 6 refills | Status: DC

## 2019-12-26 NOTE — Progress Notes (Signed)
Primary Physician/Referring:  Deland Pretty, MD  Patient ID: Todd Wilkinson, male    DOB: [birth year], 86 y.o.   MRN: [id number]  Chief Complaint  Patient presents with  . Atrial Fibrillation  . New Patient (Initial Visit)    Ref by Dr. Shelia Media   HPI:    Todd Wilkinson  is a 86 y.o. Caucasian male with mild hyperlipidemia but otherwise no significant prior cardiovascular history, referred to me for evaluation of atrial fibrillation.  He remains asymptomatic, except for occasional cramping in bilateral calves and also complains of easily bruising.  No history of fall, no stroke in the past, no history of hypertension or diabetes mellitus.  Past Medical History:  Diagnosis Date  . Hyperlipidemia    Past Surgical History:  Procedure Laterality Date  . BACK SURGERY     Social History   Tobacco Use  . Smoking status: Former Smoker    Quit date: 11/17/1946    Years since quitting: 73.1  . Smokeless tobacco: Never Used  Substance Use Topics  . Alcohol use: Not Currently    ROS  Review of Systems  Cardiovascular: Positive for claudication (bilateral calf). Negative for dyspnea on exertion, leg swelling and near-syncope.  Hematologic/Lymphatic: Bruises/bleeds easily (bruising).  Gastrointestinal: Negative for melena.   Objective  Blood pressure (!) 168/82, pulse 69, temperature (!) 95.9 F (35.5 C), temperature source Temporal, resp. rate 18, height '5\' 9"'$  (1.753 m), weight 164 lb 4.8 oz (74.5 kg), SpO2 97 %.  Vitals with BMI 12/26/2019 09/13/2018 09/13/2018  Height '5\' 9"'$  - -  Weight 164 lbs 5 oz - -  BMI 41.74 - -  Systolic 081 448 -  Diastolic 82 94 -  Pulse 69 78 75     Physical Exam  Constitutional: He appears well-developed and well-nourished.  HENT:  Head: Atraumatic.  Eyes: Conjunctivae are normal.  Cardiovascular: Normal rate, regular rhythm, S1 normal and S2 normal. Exam reveals no gallop.  Murmur heard. High-pitched blowing midsystolic murmur is present with a  grade of 2/6 at the apex radiating to the axilla. Pulses:      Carotid pulses are 2+ on the right side and 2+ on the left side.      Femoral pulses are 2+ on the right side and 2+ on the left side.      Dorsalis pedis pulses are 1+ on the right side and 1+ on the left side.       Posterior tibial pulses are 0 on the right side and 0 on the left side.  No edema. No JVD.   Pulmonary/Chest: Effort normal and breath sounds normal.  Abdominal: Soft. Bowel sounds are normal.  Skin: Skin is warm and dry.   Laboratory examination:   No results for input(s): NA, K, CL, CO2, GLUCOSE, BUN, CREATININE, CALCIUM, GFRNONAA, GFRAA in the last 8760 hours. CrCl cannot be calculated (Patient's most recent lab result is older than the maximum 21 days allowed.).  CMP Latest Ref Rng & Units 09/13/2018 08/25/2018 04/12/2009  Glucose 70 - 99 mg/dL 120(H) 116(H) 137(H)  BUN 8 - 23 mg/dL '8 10 14  '$ Creatinine 0.61 - 1.24 mg/dL 0.98 1.09 1.1  Sodium 135 - 145 mmol/L 140 139 142  Potassium 3.5 - 5.1 mmol/L 3.7 3.9 3.7  Chloride 98 - 111 mmol/L 108 106 108  CO2 22 - 32 mmol/L 23 24 -  Calcium 8.9 - 10.3 mg/dL 9.2 8.9 -  Total Protein 6.5 - 8.1 g/dL - 6.4(L) -  Total Bilirubin 0.3 - 1.2 mg/dL - 0.9 -  Alkaline Phos 38 - 126 U/L - 58 -  AST 15 - 41 U/L - 22 -  ALT 0 - 44 U/L - 18 -   CBC Latest Ref Rng & Units 09/13/2018 08/25/2018 04/12/2009  WBC 4.0 - 10.5 K/uL 6.7 6.5 -  Hemoglobin 13.0 - 17.0 g/dL 12.0(L) 11.8(L) 13.3  Hematocrit 39.0 - 52.0 % 36.9(L) 35.9(L) 39.0  Platelets 150 - 400 K/uL 146(L) 144(L) -   Lipid Panel  No results found for: CHOL, TRIG, HDL, CHOLHDL, VLDL, LDLCALC, LDLDIRECT HEMOGLOBIN A1C No results found for: HGBA1C, MPG TSH No results for input(s): TSH in the last 8760 hours.  External labs :  Labs 10/18/2019: Serum glucose 90 mg, BUN 14, creatinine 1.20, EGFR 52 mL, potassium 4.2, sodium 140.  CMP normal.  Cholesterol 103, triglycerides 61, HDL 45, LDL 44.  Hb 11.8/HCT 35.0,  platelets 129, normal indicis.  Medications and allergies  No Known Allergies   Current Outpatient Medications  Medication Instructions  . b complex vitamins capsule 1 capsule, Oral, Daily  . LORazepam (ATIVAN) 1 mg, Oral, Every 8 hours PRN  . losartan (COZAAR) 50 mg, Oral, Daily  . omeprazole (PRILOSEC) 20 mg, Oral, 2 times daily PRN  . pravastatin (PRAVACHOL) 80 MG tablet 1 tablet, Oral, Daily  . Rivaroxaban (XARELTO) 15 mg, Oral, Daily with supper  . tamsulosin (FLOMAX) 0.4 mg, Oral, Daily   Radiology:  No results found.  Cardiac Studies:   None  Assessment     ICD-10-CM   1. Atrial fibrillation, chronic ([place]). CHA2DS2-VASc Score is 4.  Yearly risk of stroke: 4%(A, HTN, PAD).  I48.20 EKG 12-Lead    Rivaroxaban (XARELTO) 15 MG TABS tablet    PCV ECHOCARDIOGRAM COMPLETE  2. Systolic hypertension  X10 losartan (COZAAR) 50 MG tablet    BASIC METABOLIC PANEL WITH GFR    PCV ECHOCARDIOGRAM COMPLETE    EKG 12/26/2019: Atrial fibrillation with controlled ventricular response at rate of 48 bpm, left axis deviation, left anterior fascicular block.  Right bundle branch block.  Nonspecific T abnormality.  No significant change compared to 10/31/2019.     Meds ordered this encounter  Medications  . losartan (COZAAR) 50 MG tablet    Sig: Take 1 tablet (50 mg total) by mouth daily.    Dispense:  30 tablet    Refill:  2  . Rivaroxaban (XARELTO) 15 MG TABS tablet    Sig: Take 1 tablet (15 mg total) by mouth daily with supper.    Dispense:  30 tablet    Refill:  6    Medications Discontinued During This Encounter  Medication Reason  . Ascorbic Acid (VITAMIN C PO) Error    Recommendations:   Todd Wilkinson  is a 86 y.o. active Caucasian male with mild hyperlipidemia but otherwise no significant prior cardiovascular history, referred to me for evaluation of atrial fibrillation. He remains asymptomatic. I also took care of his wife who passed away recently.   I had a long  discussion with the patient and also his daughter who is present at the bedside, the patient has at least moderate to high risk for cardioembolic phenomena.  Also has easy bruising, risk of stroke is very high as he was hypertensive today for which I have started him on losartan 50 mg daily, he has vascular disease by physical exam with reduced pedal pulses, his age contributing to his risk for.  We will start him on  Xarelto 15 mg after dinner daily in view of stage III chronic kidney disease.  I will obtain a BMP and CBC in 2 to 3 weeks from now.  He has at least a moderate mitral regurgitation on exam, will obtain an echocardiogram.  Conservative therapy for atrial fibrillation, no indication for evaluation for asymptomatic CAD.  We could consider one-time direct-current cardioversion.  Adrian Prows, MD, [facility] 12/26/2019, 3:01 PM [facility]. [place] Office: [phone]

## 2019-12-31 DIAGNOSIS — R300 Dysuria: Secondary | ICD-10-CM | POA: Diagnosis not present

## 2020-01-07 ENCOUNTER — Encounter (HOSPITAL_COMMUNITY): Payer: Self-pay

## 2020-01-07 ENCOUNTER — Inpatient Hospital Stay (HOSPITAL_COMMUNITY)
Admission: EM | Admit: 2020-01-07 | Discharge: 2020-01-15 | DRG: 660 | Disposition: A | Discharge status: Home Health | Payer: Medicare Other | Attending: Internal Medicine | Admitting: Internal Medicine

## 2020-01-07 ENCOUNTER — Emergency Department (HOSPITAL_COMMUNITY): Payer: Medicare Other

## 2020-01-07 ENCOUNTER — Other Ambulatory Visit: Payer: Self-pay

## 2020-01-07 DIAGNOSIS — E785 Hyperlipidemia, unspecified: Secondary | ICD-10-CM | POA: Diagnosis not present

## 2020-01-07 DIAGNOSIS — N3001 Acute cystitis with hematuria: Secondary | ICD-10-CM | POA: Diagnosis not present

## 2020-01-07 DIAGNOSIS — R001 Bradycardia, unspecified: Secondary | ICD-10-CM | POA: Diagnosis present

## 2020-01-07 DIAGNOSIS — N210 Calculus in bladder: Secondary | ICD-10-CM | POA: Diagnosis not present

## 2020-01-07 DIAGNOSIS — R1904 Left lower quadrant abdominal swelling, mass and lump: Secondary | ICD-10-CM | POA: Diagnosis present

## 2020-01-07 DIAGNOSIS — K5909 Other constipation: Secondary | ICD-10-CM | POA: Diagnosis present

## 2020-01-07 DIAGNOSIS — Z66 Do not resuscitate: Secondary | ICD-10-CM | POA: Diagnosis present

## 2020-01-07 DIAGNOSIS — I4891 Unspecified atrial fibrillation: Secondary | ICD-10-CM | POA: Diagnosis not present

## 2020-01-07 DIAGNOSIS — N390 Urinary tract infection, site not specified: Secondary | ICD-10-CM | POA: Diagnosis not present

## 2020-01-07 DIAGNOSIS — I4821 Permanent atrial fibrillation: Secondary | ICD-10-CM | POA: Diagnosis not present

## 2020-01-07 DIAGNOSIS — N400 Enlarged prostate without lower urinary tract symptoms: Secondary | ICD-10-CM | POA: Diagnosis present

## 2020-01-07 DIAGNOSIS — F05 Delirium due to known physiological condition: Secondary | ICD-10-CM | POA: Diagnosis present

## 2020-01-07 DIAGNOSIS — Z7901 Long term (current) use of anticoagulants: Secondary | ICD-10-CM

## 2020-01-07 DIAGNOSIS — N132 Hydronephrosis with renal and ureteral calculous obstruction: Secondary | ICD-10-CM | POA: Diagnosis not present

## 2020-01-07 DIAGNOSIS — R972 Elevated prostate specific antigen [PSA]: Secondary | ICD-10-CM | POA: Diagnosis present

## 2020-01-07 DIAGNOSIS — Z03818 Encounter for observation for suspected exposure to other biological agents ruled out: Secondary | ICD-10-CM | POA: Diagnosis not present

## 2020-01-07 DIAGNOSIS — I130 Hypertensive heart and chronic kidney disease with heart failure and stage 1 through stage 4 chronic kidney disease, or unspecified chronic kidney disease: Secondary | ICD-10-CM | POA: Diagnosis not present

## 2020-01-07 DIAGNOSIS — Z8249 Family history of ischemic heart disease and other diseases of the circulatory system: Secondary | ICD-10-CM

## 2020-01-07 DIAGNOSIS — Z87891 Personal history of nicotine dependence: Secondary | ICD-10-CM

## 2020-01-07 DIAGNOSIS — R17 Unspecified jaundice: Secondary | ICD-10-CM | POA: Diagnosis present

## 2020-01-07 DIAGNOSIS — N401 Enlarged prostate with lower urinary tract symptoms: Secondary | ICD-10-CM | POA: Diagnosis present

## 2020-01-07 DIAGNOSIS — R339 Retention of urine, unspecified: Secondary | ICD-10-CM | POA: Diagnosis not present

## 2020-01-07 DIAGNOSIS — Z79899 Other long term (current) drug therapy: Secondary | ICD-10-CM | POA: Diagnosis not present

## 2020-01-07 DIAGNOSIS — I10 Essential (primary) hypertension: Secondary | ICD-10-CM | POA: Diagnosis present

## 2020-01-07 DIAGNOSIS — N1330 Unspecified hydronephrosis: Secondary | ICD-10-CM

## 2020-01-07 DIAGNOSIS — K5900 Constipation, unspecified: Secondary | ICD-10-CM

## 2020-01-07 DIAGNOSIS — I480 Paroxysmal atrial fibrillation: Secondary | ICD-10-CM | POA: Diagnosis present

## 2020-01-07 DIAGNOSIS — Z20822 Contact with and (suspected) exposure to covid-19: Secondary | ICD-10-CM | POA: Diagnosis present

## 2020-01-07 DIAGNOSIS — N136 Pyonephrosis: Secondary | ICD-10-CM | POA: Diagnosis present

## 2020-01-07 DIAGNOSIS — I482 Chronic atrial fibrillation, unspecified: Secondary | ICD-10-CM

## 2020-01-07 DIAGNOSIS — R590 Localized enlarged lymph nodes: Secondary | ICD-10-CM | POA: Diagnosis not present

## 2020-01-07 DIAGNOSIS — R338 Other retention of urine: Secondary | ICD-10-CM | POA: Diagnosis not present

## 2020-01-07 DIAGNOSIS — R310 Gross hematuria: Secondary | ICD-10-CM | POA: Diagnosis not present

## 2020-01-07 DIAGNOSIS — N179 Acute kidney failure, unspecified: Secondary | ICD-10-CM | POA: Diagnosis not present

## 2020-01-07 LAB — BASIC METABOLIC PANEL
Anion gap: 15 (ref 5–15)
BUN: 112 mg/dL — ABNORMAL HIGH (ref 8–23)
CO2: 17 mmol/L — ABNORMAL LOW (ref 22–32)
Calcium: 8.4 mg/dL — ABNORMAL LOW (ref 8.9–10.3)
Chloride: 101 mmol/L (ref 98–111)
Creatinine, Ser: 5.8 mg/dL — ABNORMAL HIGH (ref 0.61–1.24)
GFR calc Af Amer: 9 mL/min — ABNORMAL LOW (ref >60)
GFR calc non Af Amer: 8 mL/min — ABNORMAL LOW (ref >60)
Glucose, Bld: 109 mg/dL — ABNORMAL HIGH (ref 70–99)
Potassium: 4.2 mmol/L (ref 3.5–5.1)
Sodium: 133 mmol/L — ABNORMAL LOW (ref 135–145)

## 2020-01-07 LAB — CBC WITH DIFFERENTIAL/PLATELET
Abs Immature Granulocytes: 0.11 10*3/uL — ABNORMAL HIGH (ref 0.00–0.07)
Basophils Absolute: 0 10*3/uL (ref 0.0–0.1)
Basophils Relative: 0 %
Eosinophils Absolute: 0 10*3/uL (ref 0.0–0.5)
Eosinophils Relative: 0 %
HCT: 34.8 % — ABNORMAL LOW (ref 39.0–52.0)
Hemoglobin: 11.6 g/dL — ABNORMAL LOW (ref 13.0–17.0)
Immature Granulocytes: 1 %
Lymphocytes Relative: 7 %
Lymphs Abs: 1.2 10*3/uL (ref 0.7–4.0)
MCH: 33.8 pg (ref 26.0–34.0)
MCHC: 33.3 g/dL (ref 30.0–36.0)
MCV: 101.5 fL — ABNORMAL HIGH (ref 80.0–100.0)
Monocytes Absolute: 2.9 10*3/uL — ABNORMAL HIGH (ref 0.1–1.0)
Monocytes Relative: 17 %
Neutro Abs: 12.7 10*3/uL — ABNORMAL HIGH (ref 1.7–7.7)
Neutrophils Relative %: 75 %
Platelets: 136 10*3/uL — ABNORMAL LOW (ref 150–400)
RBC: 3.43 MIL/uL — ABNORMAL LOW (ref 4.22–5.81)
RDW: 14.4 % (ref 11.5–15.5)
WBC: 16.9 10*3/uL — ABNORMAL HIGH (ref 4.0–10.5)
nRBC: 0 % (ref 0.0–0.2)

## 2020-01-07 LAB — HEPATIC FUNCTION PANEL
ALT: 25 U/L (ref 0–44)
AST: 22 U/L (ref 15–41)
Albumin: 3.1 g/dL — ABNORMAL LOW (ref 3.5–5.0)
Alkaline Phosphatase: 76 U/L (ref 38–126)
Bilirubin, Direct: 2.4 mg/dL — ABNORMAL HIGH (ref 0.0–0.2)
Indirect Bilirubin: 1.7 mg/dL — ABNORMAL HIGH (ref 0.3–0.9)
Total Bilirubin: 4.1 mg/dL — ABNORMAL HIGH (ref 0.3–1.2)
Total Protein: 6.4 g/dL — ABNORMAL LOW (ref 6.5–8.1)

## 2020-01-07 LAB — URINALYSIS, MICROSCOPIC (REFLEX): RBC / HPF: >50 RBC/hpf (ref 0–5)

## 2020-01-07 LAB — URINALYSIS, ROUTINE W REFLEX MICROSCOPIC
Glucose, UA: NEGATIVE mg/dL
Ketones, ur: NEGATIVE mg/dL
Nitrite: POSITIVE — AB
Protein, ur: 100 mg/dL — AB
Specific Gravity, Urine: 1.02 (ref 1.005–1.030)
pH: 5 (ref 5.0–8.0)

## 2020-01-07 LAB — APTT: aPTT: 52 seconds — ABNORMAL HIGH (ref 24–36)

## 2020-01-07 LAB — SARS CORONAVIRUS 2 (TAT 6-24 HRS): SARS Coronavirus 2: NEGATIVE

## 2020-01-07 LAB — HEPARIN LEVEL (UNFRACTIONATED): Heparin Unfractionated: >2.2 IU/mL — ABNORMAL HIGH (ref 0.30–0.70)

## 2020-01-07 MED ORDER — SODIUM CHLORIDE 0.9 % IV SOLN
1.0000 g | Freq: Once | INTRAVENOUS | Status: AC
  Administered 2020-01-07: 1 g via INTRAVENOUS
  Filled 2020-01-07: qty 10

## 2020-01-07 MED ORDER — B COMPLEX VITAMINS PO CAPS: 1.0000 | ORAL_CAPSULE | Freq: Every day | ORAL | Status: DC

## 2020-01-07 MED ORDER — OXYBUTYNIN CHLORIDE 5 MG PO TABS
5.0000 mg | ORAL_TABLET | Freq: Three times a day (TID) | ORAL | Status: DC
  Administered 2020-01-07: 5 mg via ORAL
  Filled 2020-01-07: qty 1

## 2020-01-07 MED ORDER — POLYETHYLENE GLYCOL 3350 17 G PO PACK
17.0000 g | PACK | Freq: Every day | ORAL | Status: DC
  Administered 2020-01-08 – 2020-01-14 (×6): 17 g via ORAL
  Filled 2020-01-07 (×8): qty 1

## 2020-01-07 MED ORDER — MORPHINE SULFATE (PF) 4 MG/ML IV SOLN
4.0000 mg | Freq: Once | INTRAVENOUS | Status: AC
  Administered 2020-01-07: 4 mg via INTRAVENOUS
  Filled 2020-01-07: qty 1

## 2020-01-07 MED ORDER — LORAZEPAM 0.5 MG PO TABS
0.5000 mg | ORAL_TABLET | Freq: Two times a day (BID) | ORAL | Status: DC
  Administered 2020-01-07 – 2020-01-09 (×4): 0.5 mg via ORAL
  Administered 2020-01-10: 1 mg via ORAL
  Administered 2020-01-10: 0.5 mg via ORAL
  Administered 2020-01-11 (×2): 1 mg via ORAL
  Administered 2020-01-12: 0.5 mg via ORAL
  Administered 2020-01-12: 1 mg via ORAL
  Administered 2020-01-13 – 2020-01-15 (×5): 0.5 mg via ORAL
  Filled 2020-01-07 (×3): qty 1
  Filled 2020-01-07 (×6): qty 2
  Filled 2020-01-07 (×2): qty 1
  Filled 2020-01-07: qty 2
  Filled 2020-01-07 (×4): qty 1

## 2020-01-07 MED ORDER — TRAMADOL HCL 50 MG PO TABS
50.0000 mg | ORAL_TABLET | Freq: Two times a day (BID) | ORAL | Status: DC | PRN
  Administered 2020-01-07: 50 mg via ORAL
  Filled 2020-01-07: qty 1

## 2020-01-07 MED ORDER — HEPARIN (PORCINE) 25000 UT/250ML-% IV SOLN
1150.0000 [IU]/h | INTRAVENOUS | Status: DC
  Administered 2020-01-07: 18:00:00 1150 [IU]/h via INTRAVENOUS
  Filled 2020-01-07: qty 250

## 2020-01-07 MED ORDER — TAMSULOSIN HCL 0.4 MG PO CAPS
0.4000 mg | ORAL_CAPSULE | Freq: Every day | ORAL | Status: DC
  Administered 2020-01-08 – 2020-01-10 (×3): 0.4 mg via ORAL
  Filled 2020-01-07 (×3): qty 1

## 2020-01-07 MED ORDER — CEFTRIAXONE SODIUM 1 G IJ SOLR
1.0000 g | Freq: Once | INTRAMUSCULAR | Status: DC
  Filled 2020-01-07: qty 10

## 2020-01-07 MED ORDER — B COMPLEX-C PO TABS
1.0000 | ORAL_TABLET | Freq: Every day | ORAL | Status: DC
  Administered 2020-01-08 – 2020-01-15 (×8): 1 via ORAL
  Filled 2020-01-07 (×8): qty 1

## 2020-01-07 NOTE — ED Notes (Signed)
Bladder Scan showed >999 MD Provider made aware

## 2020-01-07 NOTE — ED Notes (Signed)
Daughter Meredith Mody verified she has pts cane and glasses in her possession

## 2020-01-07 NOTE — ED Notes (Signed)
Pt provided crackers PB and approx 247ml water

## 2020-01-07 NOTE — ED Notes (Signed)
Bladder irrigation complete   300 ml new output recorded

## 2020-01-07 NOTE — ED Provider Notes (Signed)
[facility] DEPT Provider Note   CSN: [id number] Arrival date & time: 01/07/20  [id number]    History Chief Complaint  Patient presents with  . Bladder Pain    BARD NATTRESS is a 86 y.o. male with past medical history significant for prostate problems, A. fib on Xarelto who presents for evaluation of difficulty urinating.  Patient seen at urgent care last week.  Start on Cipro for possible UTI.  Has been taking the Cipro as well as Azo.  He is not followed by urology.  States he has had difficulty with his stream for over a week.  Denies fever, chills, nausea, vomiting, chest pain, shortness of breath, abdominal pain, pain with bowel movements, redness, swelling, warmth to his penis.  Apparently on arrival he had a distended lower abdomen some suprapubic pain.  Bladder scan with greater than 1000 cc and Foley catheter was placed prior to my assessment. He does admit to burning to the tip of his penis after foley placement. Denies additional aggravating or alleviating factors.  History obtained from patient and past medical records.  No interpreter is used.    HPI     Past Medical History:  Diagnosis Date  . Hyperlipidemia     There are no problems to display for this patient.   Past Surgical History:  Procedure Laterality Date  . BACK SURGERY         Family History  Problem Relation Age of Onset  . Cancer Mother   . Heart attack Father     Social History   Tobacco Use  . Smoking status: Former Smoker    Quit date: 11/17/1946    Years since quitting: 73.1  . Smokeless tobacco: Never Used  Substance Use Topics  . Alcohol use: Not Currently  . Drug use: Never    Home Medications Prior to Admission medications   Medication Sig Start Date End Date Taking? Authorizing Provider  b complex vitamins capsule Take 1 capsule by mouth daily.   Yes [provider]  ciprofloxacin (CIPRO) 500 MG tablet Take 500 mg by mouth 2 (two) times  daily. 12/31/19 01/14/20 Yes [provider]  LORazepam (ATIVAN) 0.5 MG tablet Take 0.5-1 mg by mouth 2 (two) times daily.    Yes [provider]  LORazepam (ATIVAN) 1 MG tablet Take 1 mg by mouth every 8 (eight) hours as needed for anxiety.   Yes [provider]  losartan (COZAAR) 50 MG tablet Take 1 tablet (50 mg total) by mouth daily. 12/26/19 03/25/20 Yes Adrian Prows, MD  omeprazole (PRILOSEC) 20 MG capsule Take 20 mg by mouth 2 (two) times daily as needed.   Yes [provider]  pravastatin (PRAVACHOL) 80 MG tablet Take 1 tablet by mouth daily. 07/11/19  Yes [provider]  Rivaroxaban (XARELTO) 15 MG TABS tablet Take 1 tablet (15 mg total) by mouth daily with supper. 12/26/19  Yes Adrian Prows, MD  tamsulosin (FLOMAX) 0.4 MG CAPS Take 0.4 mg by mouth daily.   Yes [provider]    Allergies    Patient has no known allergies.  Review of Systems   Review of Systems  Constitutional: Negative.   HENT: Negative.   Respiratory: Negative.   Cardiovascular: Negative.   Gastrointestinal: Positive for abdominal pain. Negative for abdominal distention, anal bleeding, blood in stool, constipation, diarrhea, nausea, rectal pain and vomiting.  Genitourinary: Positive for decreased urine volume, difficulty urinating, hematuria and penile pain. Negative for discharge, flank pain,  frequency, genital sores, penile swelling, scrotal swelling, testicular pain and urgency.  Musculoskeletal: Negative.   Skin: Negative.   Neurological: Negative.   All other systems reviewed and are negative.   Physical Exam Updated Vital Signs BP (!) 122/52   Pulse 71   Temp (!) 97.5 F (36.4 C) (Oral)   Resp 18   SpO2 100%   Physical Exam Vitals and nursing note reviewed. Exam conducted with a chaperone present.  Constitutional:      General: He is not in acute distress.    Appearance: He is well-developed. He is not ill-appearing, toxic-appearing or diaphoretic.    HENT:     Head: Normocephalic and atraumatic.     Nose: Nose normal.     Mouth/Throat:     Mouth: Mucous membranes are moist.     Pharynx: Oropharynx is clear.  Eyes:     Pupils: Pupils are equal, round, and reactive to light.  Cardiovascular:     Rate and Rhythm: Normal rate and regular rhythm.     Pulses: Normal pulses.     Heart sounds: Normal heart sounds.  Pulmonary:     Effort: Pulmonary effort is normal. No respiratory distress.     Breath sounds: Normal breath sounds.  Abdominal:     General: Bowel sounds are normal. There is no distension.     Palpations: Abdomen is soft.     Tenderness: There is no abdominal tenderness. There is no right CVA tenderness, left CVA tenderness, guarding or rebound.     Comments: Soft, nontender without rebound or guarding  Genitourinary:    Penis: Normal.      Testes: Normal. Cremasteric reflex is present.     Epididymis:     Right: Normal.     Left: Normal.     Comments: Foley cath in place without surrounding drainage or bleeding. Gross blood in urinary bag. Declined Rectal exam. Musculoskeletal:        General: Normal range of motion.     Cervical back: Normal range of motion and neck supple.  Skin:    General: Skin is warm and dry.     Capillary Refill: Capillary refill takes less than 2 seconds.     Comments: Brisk cap refill.  Neurological:     Mental Status: He is alert.    ED Results / Procedures / Treatments   Labs (all labs ordered are listed, but only abnormal results are displayed) Labs Reviewed  URINALYSIS, ROUTINE W REFLEX MICROSCOPIC - Abnormal; Notable for the following components:      Result Value   Color, Urine ORANGE (*)    APPearance TURBID (*)    Hgb urine dipstick LARGE (*)    Bilirubin Urine SMALL (*)    Protein, ur 100 (*)    Nitrite POSITIVE (*)    Leukocytes,Ua SMALL (*)    All other components within normal limits  URINALYSIS, MICROSCOPIC (REFLEX) - Abnormal; Notable for the following components:    Bacteria, UA FEW (*)    All other components within normal limits  CBC WITH DIFFERENTIAL/PLATELET - Abnormal; Notable for the following components:   WBC 16.9 (*)    RBC 3.43 (*)    Hemoglobin 11.6 (*)    HCT 34.8 (*)    MCV 101.5 (*)    Platelets 136 (*)    Neutro Abs 12.7 (*)    Monocytes Absolute 2.9 (*)    Abs Immature Granulocytes 0.11 (*)    All other components within normal limits  BASIC METABOLIC PANEL - Abnormal; Notable for the following components:   Sodium 133 (*)    CO2 17 (*)    Glucose, Bld 109 (*)    BUN 112 (*)    Creatinine, Ser 5.80 (*)    Calcium 8.4 (*)    GFR calc non Af Amer 8 (*)    GFR calc Af Amer 9 (*)    All other components within normal limits  URINE CULTURE  SARS CORONAVIRUS 2 (TAT 6-24 HRS)  HEPATIC FUNCTION PANEL    EKG None  Radiology US Renal  Result Date: 01/07/2020 CLINICAL DATA:  Acute kidney injury EXAM: RENAL / URINARY TRACT ULTRASOUND COMPLETE COMPARISON:  None. FINDINGS: Right Kidney: Renal measurements: 12.7 x 5.5 x 6.2 cm = volume: 226 mL . Echogenicity within normal limits. No mass or hydronephrosis visualized. Left Kidney: Renal measurements: 11.9 x 5.9 x 6.3 cm = volume: 231 mL. There is moderate hydronephrosis with debris in the renal pelvis. Bladder: There is a Foley catheter within the urinary bladder. Other: Hypoechoic mass in the left lower quadrant measuring 4.3 x 2.3 x 4.3 cm, possibly a lymph node. IMPRESSION: 1. Moderate left hydronephrosis with debris in the renal pelvis. 2. 4.3 cm mass in the left lower quadrant, possibly a enlarged lymph node. 3. CT of the abdomen and pelvis is recommended for further evaluation. Electronically Signed   By: Ulyses Jarred M.D.   On: 01/07/2020 15:03    Procedures Procedures (including critical care time)  Medications Ordered in ED Medications  oxybutynin (DITROPAN) tablet 5 mg (5 mg Oral Given 01/07/20 1247)  cefTRIAXone (ROCEPHIN) 1 g in sodium chloride 0.9 % 100 mL IVPB (0 g  Intravenous Stopped 01/07/20 1317)  morphine 4 MG/ML injection 4 mg (4 mg Intravenous Given 01/07/20 1327)    ED Course  I have reviewed the triage vital signs and the nursing notes.  Pertinent labs & imaging results that were available during my care of the patient were reviewed by me and considered in my medical decision making (see chart for details).   86 year old male appears otherwise well presents for evaluation of difficulty urinating.  On Cipro and Azo for positive UTI last week at urgent care.  Patient with some suprapubic discomfort.  Foley catheter placed after greater than 1000 cc on bladder scan.  Urine actively flowing with 1 small clot after irrigation.  There is gross blood in his Foley bag.  Patient with some possible bladder spasms.  Will give medication, obtain some basic labs, reevaluate.  Patient seen evaluated by attending physician, Dr. Eulis Foster who agrees with above treatment, plan and disposition.  Clinical Course as of Jan 06 1518  Sat Jan 07, 2020  1351 Leukocytosis of 16.9, 11.6, consistent with prior labs  CBC with Differential(!) [BH]  1351 Large blood, positive for UTI will culture  Urinalysis, Routine w reflex microscopic(!) [BH]  1405 Creatinine 5.8, up from previous, question likely related due to retention.  Basic metabolic panel(!) [BH]  [id number] IMPRESSION: 1. Moderate left hydronephrosis with debris in the renal pelvis. 2. 4.3 cm mass in the left lower quadrant, possibly a enlarged lymph node. 3. CT of the abdomen and pelvis is recommended for further evaluation.    [BH]    Clinical Course User Index [BH] Rajean Desantiago A, PA-C    Patient reassessed. Comfortable in room. Discussed plan with admission with daughter Amy Pyler patients emergency contact. All questions answered.  Reassessed.  Sleeping soundly.  Discussed admission.  He is  agreeable to this. Patient AKI likely due to retention.  Repeat bladder scan was 0 in the bladder.  No clots in  bag.  Do not think patient needs bladder irrigation at this time.  CONSULT Dr. Teryl Lucy with [facility] who will evaluate patient for admission.  Patient seen evaluated with attending physician, Dr. Eulis Foster who agrees with above treatment, plan and disposition. MDM Rules/Calculators/A&P                       Final Clinical Impression(s) / ED Diagnoses Final diagnoses:  Acute cystitis with hematuria  AKI (acute kidney injury) [facility])  Urinary retention    Rx / DC Orders ED Discharge Orders    None       Rashawnda Gaba A, PA-C 01/07/20 1519    Daleen Bo, MD 01/07/20 1536

## 2020-01-07 NOTE — H&P (Addendum)
History and Physical    Todd Wilkinson [id number] DOB: Aug 24, 1925 DOA: 01/07/2020  PCP: Deland Pretty, MD Patient coming from: Home  Chief Complaint: Urinary retention  HPI: Todd Wilkinson is a 86 y.o. male with medical history significant of hypertension, hyperlipidemia, atrial fibrillation on anticoagulation, BPH. Patient states that he went to urgent care last Saturday and was told he had a bad UTI and was given 2 weeks of ciprofloxacin. Per daughter, symptoms of hesitancy and dysuria continued. He had taken 7/14 days of his Ciprofloxacin (which made him confused initially). Her daughter reports he was taking Azo to help with symptoms and noted that at the urgent care, his urine was orange.  ED Course: Vitals: Temperature 97.5 F, pulse of 80 bpm, respirations of 17, BP of 134/64, 90% on room air Labs: Sodium 133, CO2 17, BUN of 112, creatinine 5.8, WBC of 16,900 Imaging: Renal ultrasound significant for moderate left hydronephrosis in addition to a 4.3 cm mass of the left lower quadrant which is described as a possible enlarged lymph node Medications/Course: Morphine, Ceftriaxone, oxybutynin  Review of Systems: Review of Systems  Constitutional: Negative for chills and fever.  Respiratory: Positive for sputum production. Negative for shortness of breath.   Cardiovascular: Negative for chest pain.  Gastrointestinal: Positive for constipation. Negative for abdominal pain, diarrhea, nausea and vomiting.  Genitourinary: Positive for dysuria and hematuria. Negative for flank pain.    Past Medical History:  Diagnosis Date  . Hyperlipidemia     Past Surgical History:  Procedure Laterality Date  . BACK SURGERY       reports that he quit smoking about 73 years ago. He has never used smokeless tobacco. He reports previous alcohol use. He reports that he does not use drugs.  No Known Allergies  Family History  Problem Relation Age of Onset  . Cancer Mother   . Heart attack  Father    Prior to Admission medications   Medication Sig Start Date End Date Taking? Authorizing Provider  b complex vitamins capsule Take 1 capsule by mouth daily.   Yes [provider]  ciprofloxacin (CIPRO) 500 MG tablet Take 500 mg by mouth 2 (two) times daily. 12/31/19 01/14/20 Yes [provider]  LORazepam (ATIVAN) 0.5 MG tablet Take 0.5-1 mg by mouth 2 (two) times daily.    Yes [provider]  LORazepam (ATIVAN) 1 MG tablet Take 1 mg by mouth every 8 (eight) hours as needed for anxiety.   Yes [provider]  losartan (COZAAR) 50 MG tablet Take 1 tablet (50 mg total) by mouth daily. 12/26/19 03/25/20 Yes Adrian Prows, MD  omeprazole (PRILOSEC) 20 MG capsule Take 20 mg by mouth 2 (two) times daily as needed.   Yes [provider]  pravastatin (PRAVACHOL) 80 MG tablet Take 1 tablet by mouth daily. 07/11/19  Yes [provider]  Rivaroxaban (XARELTO) 15 MG TABS tablet Take 1 tablet (15 mg total) by mouth daily with supper. 12/26/19  Yes Adrian Prows, MD  tamsulosin (FLOMAX) 0.4 MG CAPS Take 0.4 mg by mouth daily.   Yes [provider]    Physical Exam:  Physical Exam Constitutional:      General: He is not in acute distress.    Appearance: He is well-developed. He is not diaphoretic.  Eyes:     Conjunctiva/sclera: Conjunctivae normal.     Pupils: Pupils are equal, round, and reactive to light.  Cardiovascular:     Rate and Rhythm: Normal rate and regular  rhythm.     Heart sounds: Normal heart sounds. No murmur.  Pulmonary:     Effort: Pulmonary effort is normal. No respiratory distress.     Breath sounds: Normal breath sounds. No wheezing or rales.  Abdominal:     General: Bowel sounds are normal. There is no distension.     Palpations: Abdomen is soft.     Tenderness: There is no abdominal tenderness. There is no guarding or rebound.  Musculoskeletal:        General: No tenderness. Normal range of motion.     Cervical back:  Normal range of motion.  Lymphadenopathy:     Cervical: No cervical adenopathy.  Skin:    General: Skin is warm and dry.  Neurological:     Mental Status: He is alert.     Comments: Oriented to self, place. Needed help orienting to date. Oriented to current president    Labs on Admission: I have personally reviewed following labs and imaging studies  CBC: Recent Labs  Lab 01/07/20 1219  WBC 16.9*  NEUTROABS 12.7*  HGB 11.6*  HCT 34.8*  MCV 101.5*  PLT 136*    Basic Metabolic Panel: Recent Labs  Lab 01/07/20 1219  NA 133*  K 4.2  CL 101  CO2 17*  GLUCOSE 109*  BUN 112*  CREATININE 5.80*  CALCIUM 8.4*    GFR: Estimated Creatinine Clearance: 8.1 mL/min (A) (by C-G formula based on SCr of 5.8 mg/dL (H)).  Liver Function Tests: No results for input(s): AST, ALT, ALKPHOS, BILITOT, PROT, ALBUMIN in the last 168 hours. No results for input(s): LIPASE, AMYLASE in the last 168 hours. No results for input(s): AMMONIA in the last 168 hours.  Coagulation Profile: No results for input(s): INR, PROTIME in the last 168 hours.  Cardiac Enzymes: No results for input(s): CKTOTAL, CKMB, CKMBINDEX, TROPONINI in the last 168 hours.  BNP (last 3 results) No results for input(s): PROBNP in the last 8760 hours.  HbA1C: No results for input(s): HGBA1C in the last 72 hours.  CBG: No results for input(s): GLUCAP in the last 168 hours.  Lipid Profile: No results for input(s): CHOL, HDL, LDLCALC, TRIG, CHOLHDL, LDLDIRECT in the last 72 hours.  Thyroid Function Tests: No results for input(s): TSH, T4TOTAL, FREET4, T3FREE, THYROIDAB in the last 72 hours.  Anemia Panel: No results for input(s): VITAMINB12, FOLATE, FERRITIN, TIBC, IRON, RETICCTPCT in the last 72 hours.  Urine analysis:    Component Value Date/Time   COLORURINE ORANGE (A) 01/07/2020 1125   APPEARANCEUR TURBID (A) 01/07/2020 1125   LABSPEC 1.020 01/07/2020 1125   PHURINE 5.0 01/07/2020 1125   GLUCOSEU  NEGATIVE 01/07/2020 1125   HGBUR LARGE (A) 01/07/2020 1125   BILIRUBINUR SMALL (A) 01/07/2020 1125   KETONESUR NEGATIVE 01/07/2020 1125   PROTEINUR 100 (A) 01/07/2020 1125   NITRITE POSITIVE (A) 01/07/2020 1125   LEUKOCYTESUR SMALL (A) 01/07/2020 1125     Radiological Exams on Admission: US Renal  Result Date: 01/07/2020 CLINICAL DATA:  Acute kidney injury EXAM: RENAL / URINARY TRACT ULTRASOUND COMPLETE COMPARISON:  None. FINDINGS: Right Kidney: Renal measurements: 12.7 x 5.5 x 6.2 cm = volume: 226 mL . Echogenicity within normal limits. No mass or hydronephrosis visualized. Left Kidney: Renal measurements: 11.9 x 5.9 x 6.3 cm = volume: 231 mL. There is moderate hydronephrosis with debris in the renal pelvis. Bladder: There is a Foley catheter within the urinary bladder. Other: Hypoechoic mass in the left lower quadrant measuring 4.3 x 2.3 x  4.3 cm, possibly a lymph node. IMPRESSION: 1. Moderate left hydronephrosis with debris in the renal pelvis. 2. 4.3 cm mass in the left lower quadrant, possibly a enlarged lymph node. 3. CT of the abdomen and pelvis is recommended for further evaluation. Electronically Signed   By: Ulyses Jarred M.D.   On: 01/07/2020 15:03    Assessment/Plan Active Problems:   AKI (acute kidney injury) ([place])   Acute kidney injury Appears to be secondary to hydronephrosis in setting of urinary retention.  Patient has a history of prostamegaly and is currently on Flomax.  Retention was relieved by placement of Foley catheter. -Daily BMP; if does not improve, will need nephrology consult -Continue Foley catheter; will need urology follow-up if patient does not pass voiding trial -Strict intake and intake: Daily weights  Acute urinary retention Unsure of etiology. Possibly from UTI vs BPH vs clot. Foley placed on admission (2/20). Started on oxybutynin in the ED. -Discontinue oxybutynin -Continue foley for now; will need voiding trial prior to discharge  UTI Urine  culture from outside records with <10,000 colonies. Failed Ciprofloxacin. Started on Ceftriaxone in the ED -Continue Ceftriaxone -Urine culture pending  Hydronephrosis Secondary to above. -Management above  Atrial fibrillation Unspecified type.  Patient is on Xarelto as an outpatient.  Patient is not on rate control medication. -Hold Xarelto secondary to renal function -Heparin drip  Hyperbilirubinemia Mixed. No known history of liver disease. LFTs normal. Normal alkaline phosphatase. -INR -RUQ ultrasound  Essential hypertension Patient is on losartan as an outpatient. -Hold losartan secondary to AKI -Hydralazine as needed  Hyperlipidemia On Pravastatin as an outpatient -Hold Pravastatin secondary to ?liver disease  Constipation -Miralax  Left lower quadrant abdominal mass Seen incidentally on renal ultrasound..  Recommendation found.  Recommendation for CT abdomen and pelvis -CT abdomen pelvis pending improvement of kidney function   DVT prophylaxis: Heparin drip Code Status: DNR Family Communication: Daughter on telephone Disposition Plan: Medical floor Consults called: None Admission status: Inpatient   Cordelia Poche, MD [facility] 01/07/2020, 3:19 PM

## 2020-01-07 NOTE — ED Notes (Signed)
US at bedside

## 2020-01-07 NOTE — ED Triage Notes (Signed)
Pt arrived POV wheelchair assist into ED CC bladder pain X1 week. Pt reports trouble "emptying his bladder", "feels like I have to go but I don't get much out it hurts to move" denies blood or foul smelling urine.  VSS Abebrile   Hx. Pt unsure if he has had prostate problems in the past

## 2020-01-07 NOTE — ED Provider Notes (Signed)
  Face-to-face evaluation   History: He presents for evaluation of trouble "emptying my bladder".  He is currently being treated for UTI.  No history of prostate trouble.  Physical exam: Alert elderly male.  Examined after Foley catheterization.  He is uncomfortable, clutching his penis with pain.  22 French Foley catheter is in the urethra, and urine is red, consistent with significant bleeding.  At this time he has mild tenderness in the suprapubic region but no palpable bladder mass.  As a trial to treat pain, his Foley catheter balloon was deflated, to ensure that it was not inflated in the urethra.  There was no change in his discomfort.  I suspect that he is having bladder spasm causing the discomfort, so will treat with a anti-spasmotic, and check labs.  Medical screening examination/treatment/procedure(s) were conducted as a shared visit with non-physician practitioner(s) and myself.  I personally evaluated the patient during the encounter    Daleen Bo, MD 01/07/20 1537

## 2020-01-07 NOTE — Progress Notes (Signed)
ANTICOAGULATION CONSULT NOTE - Initial Consult  Pharmacy Consult for Heparin Indication: atrial fibrillation   (on Xarelto PTA)  No Known Allergies  Patient Measurements:   Heparin Dosing Weight: actual body weight  Vital Signs: Temp: 97.7 F (36.5 C) (02/20 1650) Temp Source: Oral (02/20 1650) BP: 123/62 (02/20 1650) Pulse Rate: 77 (02/20 1650)  Labs: Recent Labs    01/07/20 1219  HGB 11.6*  HCT 34.8*  PLT 136*  CREATININE 5.80*    Estimated Creatinine Clearance: 8.1 mL/min (A) (by C-G formula based on SCr of 5.8 mg/dL (H)).   Medical History: Past Medical History:  Diagnosis Date  . Hyperlipidemia     Medications:  PTA:  Xarelto 15mg  daily (LD on 01/06/20 @ 18:00)  Assessment:  86 yr male admitted for acute kidney injury and acute urinary retention  PMH significant for HTN, HLD, BPY and AFib (on Xarelto PTA)  MD holding Xarelto until renal function improves; Pharmacy consulted to dose IV heparin gtt  Goal of Therapy:  Heparin level 0.3-0.7 units/ml aPTT 66-102 seconds Monitor platelets by anticoagulation protocol: Yes   Plan:   As patient's last reported dose of Xarelto was 2/19, will obtain baseline aPTT and heparin level  At 18:00 will begin IV heparin @ 1150 units/hr  Check aPTT and heparin level 8 hrs after heparin gtt started  Follow heparin level and CBC daily while on heparin gtt  Monitor heparin therapy with aPTT until effects of Xarelto have worn off and aPTT and heparin levels correlate  Kinsey Karch, Toribio Harbour, PharmD 01/07/2020,5:09 PM

## 2020-01-08 ENCOUNTER — Inpatient Hospital Stay (HOSPITAL_COMMUNITY): Payer: Medicare Other

## 2020-01-08 DIAGNOSIS — I480 Paroxysmal atrial fibrillation: Secondary | ICD-10-CM | POA: Diagnosis present

## 2020-01-08 DIAGNOSIS — I10 Essential (primary) hypertension: Secondary | ICD-10-CM | POA: Diagnosis present

## 2020-01-08 DIAGNOSIS — N400 Enlarged prostate without lower urinary tract symptoms: Secondary | ICD-10-CM | POA: Diagnosis present

## 2020-01-08 DIAGNOSIS — R1904 Left lower quadrant abdominal swelling, mass and lump: Secondary | ICD-10-CM | POA: Diagnosis present

## 2020-01-08 DIAGNOSIS — N390 Urinary tract infection, site not specified: Secondary | ICD-10-CM | POA: Diagnosis present

## 2020-01-08 DIAGNOSIS — E785 Hyperlipidemia, unspecified: Secondary | ICD-10-CM | POA: Diagnosis present

## 2020-01-08 LAB — URINE CULTURE: Culture: NO GROWTH

## 2020-01-08 LAB — RENAL FUNCTION PANEL
Albumin: 2.7 g/dL — ABNORMAL LOW (ref 3.5–5.0)
Anion gap: 15 (ref 5–15)
BUN: 118 mg/dL — ABNORMAL HIGH (ref 8–23)
CO2: 16 mmol/L — ABNORMAL LOW (ref 22–32)
Calcium: 8.1 mg/dL — ABNORMAL LOW (ref 8.9–10.3)
Chloride: 102 mmol/L (ref 98–111)
Creatinine, Ser: 5.18 mg/dL — ABNORMAL HIGH (ref 0.61–1.24)
GFR calc Af Amer: 10 mL/min — ABNORMAL LOW (ref >60)
GFR calc non Af Amer: 9 mL/min — ABNORMAL LOW (ref >60)
Glucose, Bld: 106 mg/dL — ABNORMAL HIGH (ref 70–99)
Phosphorus: 6.5 mg/dL — ABNORMAL HIGH (ref 2.5–4.6)
Potassium: 4.1 mmol/L (ref 3.5–5.1)
Sodium: 133 mmol/L — ABNORMAL LOW (ref 135–145)

## 2020-01-08 LAB — CBC
HCT: 32.2 % — ABNORMAL LOW (ref 39.0–52.0)
Hemoglobin: 11.1 g/dL — ABNORMAL LOW (ref 13.0–17.0)
MCH: 34.3 pg — ABNORMAL HIGH (ref 26.0–34.0)
MCHC: 34.5 g/dL (ref 30.0–36.0)
MCV: 99.4 fL (ref 80.0–100.0)
Platelets: 126 10*3/uL — ABNORMAL LOW (ref 150–400)
RBC: 3.24 MIL/uL — ABNORMAL LOW (ref 4.22–5.81)
RDW: 14.2 % (ref 11.5–15.5)
WBC: 15.6 10*3/uL — ABNORMAL HIGH (ref 4.0–10.5)
nRBC: 0 % (ref 0.0–0.2)

## 2020-01-08 LAB — PSA: Prostatic Specific Antigen: 72.71 ng/mL — ABNORMAL HIGH (ref 0.00–4.00)

## 2020-01-08 LAB — PROTIME-INR
INR: 2.5 — ABNORMAL HIGH (ref 0.8–1.2)
Prothrombin Time: 26.7 seconds — ABNORMAL HIGH (ref 11.4–15.2)

## 2020-01-08 LAB — HEPARIN LEVEL (UNFRACTIONATED): Heparin Unfractionated: 1.71 IU/mL — ABNORMAL HIGH (ref 0.30–0.70)

## 2020-01-08 LAB — HEPATIC FUNCTION PANEL
ALT: 22 U/L (ref 0–44)
AST: 18 U/L (ref 15–41)
Albumin: 2.8 g/dL — ABNORMAL LOW (ref 3.5–5.0)
Alkaline Phosphatase: 72 U/L (ref 38–126)
Bilirubin, Direct: 2 mg/dL — ABNORMAL HIGH (ref 0.0–0.2)
Indirect Bilirubin: 1.3 mg/dL — ABNORMAL HIGH (ref 0.3–0.9)
Total Bilirubin: 3.3 mg/dL — ABNORMAL HIGH (ref 0.3–1.2)
Total Protein: 5.9 g/dL — ABNORMAL LOW (ref 6.5–8.1)

## 2020-01-08 LAB — APTT
aPTT: 134 seconds — ABNORMAL HIGH (ref 24–36)
aPTT: 72 seconds — ABNORMAL HIGH (ref 24–36)

## 2020-01-08 MED ORDER — CHLORHEXIDINE GLUCONATE CLOTH 2 % EX PADS
6.0000 | MEDICATED_PAD | Freq: Every day | CUTANEOUS | Status: DC
  Administered 2020-01-08 – 2020-01-15 (×8): 6 via TOPICAL

## 2020-01-08 MED ORDER — SODIUM CHLORIDE 0.9 % IV SOLN
1.0000 g | INTRAVENOUS | Status: DC
  Administered 2020-01-08: 1 g via INTRAVENOUS
  Filled 2020-01-08: qty 10
  Filled 2020-01-08: qty 1

## 2020-01-08 MED ORDER — SODIUM CHLORIDE 0.9 % IV SOLN: INTRAVENOUS | Status: DC

## 2020-01-08 MED ORDER — MELATONIN 3 MG PO TABS
3.0000 mg | ORAL_TABLET | Freq: Every day | ORAL | Status: DC
  Administered 2020-01-08 – 2020-01-14 (×7): 3 mg via ORAL
  Filled 2020-01-08 (×7): qty 1

## 2020-01-08 MED ORDER — TRAZODONE HCL 50 MG PO TABS
50.0000 mg | ORAL_TABLET | Freq: Every day | ORAL | Status: DC
  Administered 2020-01-08 – 2020-01-14 (×7): 50 mg via ORAL
  Filled 2020-01-08 (×7): qty 1

## 2020-01-08 MED ORDER — HEPARIN (PORCINE) 25000 UT/250ML-% IV SOLN
850.0000 [IU]/h | INTRAVENOUS | Status: DC
  Filled 2020-01-08: qty 250

## 2020-01-08 MED ORDER — HALOPERIDOL LACTATE 5 MG/ML IJ SOLN
1.0000 mg | Freq: Four times a day (QID) | INTRAMUSCULAR | Status: DC | PRN
  Administered 2020-01-08 – 2020-01-12 (×3): 1 mg via INTRAVENOUS
  Filled 2020-01-08 (×3): qty 1

## 2020-01-08 NOTE — Progress Notes (Addendum)
ANTICOAGULATION CONSULT NOTE -  Consult  Pharmacy Consult for Heparin Indication: atrial fibrillation   (on Xarelto PTA)  No Known Allergies  Patient Measurements:   Heparin Dosing Weight: actual body weight  Vital Signs: Temp: 98.3 F (36.8 C) (02/21 1322) Temp Source: Oral (02/21 1322) BP: 100/65 (02/21 1322) Pulse Rate: 84 (02/21 1322)  Labs: Recent Labs    01/07/20 1219 01/07/20 1719 01/08/20 0215 01/08/20 1351  HGB 11.6*  --  11.1*  --   HCT 34.8*  --  32.2*  --   PLT 136*  --  126*  --   APTT  --  52* 134* 72*  LABPROT  --   --  26.7*  --   INR  --   --  2.5*  --   HEPARINUNFRC  --  >2.20* 1.71*  --   CREATININE 5.80*  --  5.18*  --    Estimated Creatinine Clearance: 9.1 mL/min (A) (by C-G formula based on SCr of 5.18 mg/dL (H)).  Medical History: Past Medical History:  Diagnosis Date  . Hyperlipidemia    Medications:  PTA:  Xarelto 15mg  daily (LD on 01/06/20 @ 18:00)  Assessment: 86 yr male admitted for acute kidney injury and acute urinary retention  PMH significant for HTN, HLD, BPY and AFib (on Xarelto PTA)  MD holding Xarelto until renal function improves; Pharmacy consulted to dose IV heparin gtt  Heparin started at 1150 units/hr, no bolus at 1800 (2/20)  Today, 01/08/2020  0215 aPTT = 134 sec, HL = 1.71 both  above goal, held Heparin x1 hr, rate   H/H = 11.1/32.2, plts= 126 (trending down from 136)  1351 rpt aPTT = 72 sec, in desired range  Goal of Therapy:  Heparin level 0.3-0.7 units/ml aPTT 66-102 seconds Monitor platelets by anticoagulation protocol: Yes   Plan:   Continue Heparin drip at 850 units/hr  Check aPTT again at 2200 tonight  Daily heparin level and CBC ordered while on heparin gtt, aPTT as needed for correlation  Monitor heparin therapy with aPTT until effects of Xarelto have worn off and aPTT and heparin levels correlate  15:10 RN called, noted patient had just pulled IV line out, will need restart, will reschedule  aPTT appropriately based on resumption of Heparin infusion.  Minda Ditto, PharmD 01/08/2020,2:56 PM

## 2020-01-08 NOTE — Progress Notes (Signed)
PROGRESS NOTE    Todd Wilkinson  [id number] DOB: 08-17-1925 DOA: 01/07/2020 PCP: Deland Pretty, MD    Brief Narrative:   Todd Wilkinson is a 86 year old Caucasian male with past medical history remarkable for subtle atrial fibrillation, HLD, essential hypertension, chronic constipation, BPH who presented to the ED complaining of bladder discomfort x1 week.  He also states feels that he has trouble emptying his bladder.  Recently seen at urgent care 2 weeks ago, diagnosed with UTI and started on ciprofloxacin; and despite treatment, symptoms of hesitancy and dysuria continued.  He completed 7/14 days of ciprofloxacin and was taking Azo to help with symptoms; with change in his urine color to orange.  In the ED, temperature 97.5, HR 80, RR 17, BP 134/64, SPO2 90% on room air.  Sodium 133, CO2 17, BUN 112, creatinine 5.8, WBC count 16.9.  Renal ultrasound notable for moderate left hydronephrosis, 4.3 cm mass left lower quadrant described as possible enlarged lymph node.  Patient was given morphine, ceftriaxone, oxybutynin and referred for admission given acute renal failure, left hydronephrosis, left lower quadrant mass, and UTI for further treatment and evaluation.   Assessment & Plan:   Principal Problem:   AKI (acute kidney injury) ([place]) Active Problems:   AF (paroxysmal atrial fibrillation) ([facility])   Essential hypertension   Hyperlipidemia   BPH (benign prostatic hyperplasia)   Urinary tract infection   Abdominal mass, LLQ (left lower quadrant)   Acute renal failure Obstructive uropathy with left hydronephrosis Patient presenting with bladder pain and decreased urine output.  Creatinine noted to be elevated to 5.80 and BUN 112 with a previous baseline on review of EMR of 0.98 in October 2019.  Patient with history of BPH on tamsulosin.  Bladder scan in the ED notable for >945mL retained urine in Foley catheter was placed.  Renal ultrasound notable for moderate left hydronephrosis  with debris in renal pelvis.   --Cr 5.80-->5.18 --BUN 112-->118 --UOP past 24hr 3,428mL --continue IVF hydration with NS at 75 mL per hour --Continue to hold home ARB --CT abdomen/pelvis without contrast for evaluation of possible extrinsic compression of left ureter from left lower quadrant mass causing left hydronephrosis, if significant may need urology evaluation --Avoid nephrotoxins, renally dose all medications --Strict I's and O's  Urinary tract infection Was on ciprofloxacin completed 7/14 days of outpatient treatment.  Urinalysis with small leukoesterase, positive nitrite, few bacteria, greater than 50 RBCs, and 11-20 WBCs. --Urine culture: Pending --Continue ceftriaxone  Left lower quadrant abdominal mass Renal ultrasound with 4.3 centimeter mass left lower quadrant, questionable enlarged lymph node. --CT abdomen/pelvis without contrast for further evaluation  Paroxysmal atrial fibrillation On Xarelto 50 mg p.o. daily outpatient, not on any rate controlling agents. --holding home xarelto with renal function decline --continue heparin gtt; pharmacy consulted for assistance with dosing/monitoring --Monitor on telemetry  Hyperlipidemia --holding home statin  Essential hypertension --Holding home losartan secondary to AKI --Hydralazine prn  BPH Review of EMR notable for PSA of 20 on 2019. --repeat PSA --Continue tamsulosin  Sundowning --Delirium precautions, lights on during the day, off at night --Melatonin 3 mg p.o. daily --Trazodone 50 mg p.o. nightly   DVT prophylaxis: Heparin drip Code Status: DNR Family Communication: updated patients daughter Todd Wilkinson via telephone this morning Disposition Plan: Remain inpatient, continue IV fluids, close monitoring of renal function with strict I's and O's, CT abdomen/pelvis for evaluation of left hydronephrosis and left lower quadrant mass, awaiting PT/OT evaluation, from home, anticipate likely need of higher level of  care on  discharge   Consultants:   none  Procedures:   none  Antimicrobials:   Ceftriaxone 2/20>>   Subjective: Patient seen and examined at bedside, resting comfortably.  Is trying to get to "[place]".  Believes he is in [facility].  States he just got off the phone with his daughter Todd Wilkinson, that she will be picking him up to go home after she goes to church today.  No other specific complaints at this time.  Denies headache, no chest pain, no palpitations, no shortness of breath, no abdominal pain, no weakness, no fatigue.  Nursing staff reports sundowning overnight, no other acute events overnight.  Objective: Vitals:   01/07/20 1600 01/07/20 1650 01/07/20 2106 01/08/20 0539  BP: 110/90 123/62 135/64 (!) 130/59  Pulse: 89 77 73 (!) 55  Resp: 18 16 18 18   Temp:  97.7 F (36.5 C) 98.1 F (36.7 C) 98 F (36.7 C)  TempSrc:  Oral Oral Oral  SpO2: 99% 99% 100% 98%    Intake/Output Summary (Last 24 hours) at 01/08/2020 1018 Last data filed at 01/08/2020 0600 Gross per 24 hour  Intake 454.01 ml  Output 3400 ml  Net -2945.99 ml   There were no vitals filed for this visit.  Examination:  General exam: Appears calm and comfortable, pleasantly confused  Respiratory system: Clear to auscultation. Respiratory effort normal. Oxygenating well on room air Cardiovascular system: S1 & S2 heard, RRR. No JVD, murmurs, rubs, gallops or clicks. No pedal edema. Gastrointestinal system: Abdomen is nondistended, soft and nontender. No organomegaly or masses felt. Normal bowel sounds heard. Central nervous system: Alert, oriented to name and time (2021), but not Place [employer] administrator) and Person (President "I can't stand that guy) . No focal neurological deficits. Extremities: Symmetric 5 x 5 power. Skin: No rashes, lesions or ulcers Psychiatry: Judgement and insight appear poor. Mood & affect appropriate.     Data Reviewed: I have personally reviewed following labs and imaging  studies  CBC: Recent Labs  Lab 01/07/20 1219 01/08/20 0215  WBC 16.9* 15.6*  NEUTROABS 12.7*  --   HGB 11.6* 11.1*  HCT 34.8* 32.2*  MCV 101.5* 99.4  PLT 136* 123XX123*   Basic Metabolic Panel: Recent Labs  Lab 01/07/20 1219 01/08/20 0215  NA 133* 133*  K 4.2 4.1  CL 101 102  CO2 17* 16*  GLUCOSE 109* 106*  BUN 112* 118*  CREATININE 5.80* 5.18*  CALCIUM 8.4* 8.1*  PHOS  --  6.5*   GFR: Estimated Creatinine Clearance: 9.1 mL/min (A) (by C-G formula based on SCr of 5.18 mg/dL (H)). Liver Function Tests: Recent Labs  Lab 01/07/20 1219 01/08/20 0215  AST 22 18  ALT 25 22  ALKPHOS 76 72  BILITOT 4.1* 3.3*  PROT 6.4* 5.9*  ALBUMIN 3.1* 2.7*  2.8*   No results for input(s): LIPASE, AMYLASE in the last 168 hours. No results for input(s): AMMONIA in the last 168 hours. Coagulation Profile: Recent Labs  Lab 01/08/20 0215  INR 2.5*   Cardiac Enzymes: No results for input(s): CKTOTAL, CKMB, CKMBINDEX, TROPONINI in the last 168 hours. BNP (last 3 results) No results for input(s): PROBNP in the last 8760 hours. HbA1C: No results for input(s): HGBA1C in the last 72 hours. CBG: No results for input(s): GLUCAP in the last 168 hours. Lipid Profile: No results for input(s): CHOL, HDL, LDLCALC, TRIG, CHOLHDL, LDLDIRECT in the last 72 hours. Thyroid Function Tests: No results for input(s): TSH, T4TOTAL, FREET4, T3FREE, THYROIDAB in  the last 72 hours. Anemia Panel: No results for input(s): VITAMINB12, FOLATE, FERRITIN, TIBC, IRON, RETICCTPCT in the last 72 hours. Sepsis Labs: No results for input(s): PROCALCITON, LATICACIDVEN in the last 168 hours.  Recent Results (from the past 240 hour(s))  SARS CORONAVIRUS 2 (TAT 6-24 HRS) Nasopharyngeal Nasopharyngeal Swab     Status: None   Collection Time: 01/07/20  2:56 PM   Specimen: Nasopharyngeal Swab  Result Value Ref Range Status   SARS Coronavirus 2 NEGATIVE NEGATIVE Final    Comment: (NOTE) SARS-CoV-2 target nucleic  acids are NOT DETECTED. The SARS-CoV-2 RNA is generally detectable in upper and lower respiratory specimens during the acute phase of infection. Negative results do not preclude SARS-CoV-2 infection, do not rule out co-infections with other pathogens, and should not be used as the sole basis for treatment or other patient management decisions. Negative results must be combined with clinical observations, patient history, and epidemiological information. The expected result is Negative. Fact Sheet for Patients: SugarRoll.be Fact Sheet for Healthcare Providers: https://www.woods-mathews.com/ This test is not yet approved or cleared by the [place] FDA and  has been authorized for detection and/or diagnosis of SARS-CoV-2 by FDA under an Emergency Use Authorization (EUA). This EUA will remain  in effect (meaning this test can be used) for the duration of the COVID-19 declaration under Section 56 4(b)(1) of the Act, 21 U.S.C. section 360bbb-3(b)(1), unless the authorization is terminated or revoked sooner. Performed at [facility], [place] [address] [place], [place] [address]          Radiology Studies: US Renal  Result Date: 01/07/2020 CLINICAL DATA:  Acute kidney injury EXAM: RENAL / URINARY TRACT ULTRASOUND COMPLETE COMPARISON:  None. FINDINGS: Right Kidney: Renal measurements: 12.7 x 5.5 x 6.2 cm = volume: 226 mL . Echogenicity within normal limits. No mass or hydronephrosis visualized. Left Kidney: Renal measurements: 11.9 x 5.9 x 6.3 cm = volume: 231 mL. There is moderate hydronephrosis with debris in the renal pelvis. Bladder: There is a Foley catheter within the urinary bladder. Other: Hypoechoic mass in the left lower quadrant measuring 4.3 x 2.3 x 4.3 cm, possibly a lymph node. IMPRESSION: 1. Moderate left hydronephrosis with debris in the renal pelvis. 2. 4.3 cm mass in the left lower quadrant, possibly a enlarged lymph node. 3.  CT of the abdomen and pelvis is recommended for further evaluation. Electronically Signed   By: Ulyses Jarred M.D.   On: 01/07/2020 15:03   DG Abd Portable 1V  Result Date: 01/07/2020 CLINICAL DATA:  Bladder pain 1 week.  Difficulty emptying bladder. EXAM: PORTABLE ABDOMEN - 1 VIEW COMPARISON:  None. FINDINGS: Catheter present over the midline pelvis likely Foley catheter. There are several air-filled nondilated large and small bowel loops. No free peritoneal air. Degenerative change of the spine. Fusion hardware intact over the lumbosacral spine from L2 to S1. IMPRESSION: Nonspecific, nonobstructive bowel gas pattern with several air-filled nondilated large and small bowel loops. Electronically Signed   By: Marin Olp M.D.   On: 01/07/2020 16:17   US Abdomen Limited RUQ  Result Date: 01/08/2020 CLINICAL DATA:  Hyperbilirubinemia EXAM: ULTRASOUND ABDOMEN LIMITED RIGHT UPPER QUADRANT COMPARISON:  None. FINDINGS: Gallbladder: No gallstones or wall thickening visualized. No sonographic Murphy sign noted by sonographer. Common bile duct: Diameter: 3 mm Liver: No focal lesion identified. Within normal limits in parenchymal echogenicity. Portal vein is patent on color Doppler imaging with normal direction of blood flow towards the liver. Other: Distended IVC that is  not changed with respiration per report. Visualization of the right kidney specifically performed on ultrasound yesterday. IMPRESSION: 1. Negative sonography of the liver and gallbladder. 2. Distended IVC, are there symptoms of right heart failure? Electronically Signed   By: Monte Fantasia M.D.   On: 01/08/2020 08:06        Scheduled Meds: . B-complex with vitamin C  1 tablet Oral Daily  . LORazepam  0.5-1 mg Oral BID  . polyethylene glycol  17 g Oral Daily  . tamsulosin  0.4 mg Oral Daily   Continuous Infusions: . sodium chloride 75 mL/hr at 01/08/20 1003  . cefTRIAXone (ROCEPHIN)  IV 1 g (01/08/20 1005)  . heparin 850 Units/hr  (01/08/20 0531)     LOS: 1 day    Time spent: 38 minutes spent on chart review, discussion with nursing staff, consultants, updating family and interview/physical exam; more than 50% of that time was spent in counseling and/or coordination of care.    Joseandres Mazer J [place] DO [facility] Available via Epic secure chat 7am-7pm After these hours, please refer to coverage provider listed on [email or web address] 01/08/2020, 10:18 AM

## 2020-01-08 NOTE — Progress Notes (Signed)
ANTICOAGULATION CONSULT NOTE -  Consult  Pharmacy Consult for Heparin Indication: atrial fibrillation   (on Xarelto PTA)  No Known Allergies  Patient Measurements:   Heparin Dosing Weight: actual body weight  Vital Signs: Temp: 98.1 F (36.7 C) (02/20 2106) Temp Source: Oral (02/20 2106) BP: 135/64 (02/20 2106) Pulse Rate: 73 (02/20 2106)  Labs: Recent Labs    01/07/20 1219 01/07/20 1719 01/08/20 0215  HGB 11.6*  --  11.1*  HCT 34.8*  --  32.2*  PLT 136*  --  126*  APTT  --  52* 134*  LABPROT  --   --  26.7*  INR  --   --  2.5*  HEPARINUNFRC  --  >2.20* 1.71*  CREATININE 5.80*  --  5.18*    Estimated Creatinine Clearance: 9.1 mL/min (A) (by C-G formula based on SCr of 5.18 mg/dL (H)).   Medical History: Past Medical History:  Diagnosis Date  . Hyperlipidemia     Medications:  PTA:  Xarelto 15mg  daily (LD on 01/06/20 @ 18:00)  Assessment:  86 yr male admitted for acute kidney injury and acute urinary retention  PMH significant for HTN, HLD, BPY and AFib (on Xarelto PTA)  MD holding Xarelto until renal function improves; Pharmacy consulted to dose IV heparin gtt Today, 2/21  0215 aptt = 134 sec and HL = 1.71 both are above goal, H/H = 11.1/32.2, plts= 126 (trending down from 136)  Goal of Therapy:  Heparin level 0.3-0.7 units/ml aPTT 66-102 seconds Monitor platelets by anticoagulation protocol: Yes   Plan:   Hold heparin drip x 1 hour  Decrease heparin drip to 850 units/hr  Check aPTT  8 hrs after heparin gtt decreased  Follow heparin level and CBC daily while on heparin gtt  Monitor heparin therapy with aPTT until effects of Xarelto have worn off and aPTT and heparin levels correlate  Dorrene German, PharmD 01/08/2020,4:00 AM

## 2020-01-08 NOTE — Progress Notes (Signed)
MD notified that patient was bladder scanned and there was 0 & 63ml output noted on scanner. There is only about 10 cc's urine in the bag and it is dark w/ blood present.  I am seeing no urine since 6 am.  Patient is not having any complaints of pain or distention. MD was also notified about heparin gtt being stopped. I only see note about continuing the drip and telemetry monitoring (yet order was present to discontinue heparin gtt during day shift).  MD will put patient on telemetry monitoring and follow up about heparin gtt. Patient will be transferred to the 4th floor for urology and telemetry monitoring. Todd Wilkinson

## 2020-01-08 NOTE — Progress Notes (Signed)
Dr [place] notified of patients confusion and getting out of bed, patient pulled IV out. Patient also had no out put from his Foley catheter which was irrigated. New order were given. Will continue to monitor.

## 2020-01-09 ENCOUNTER — Encounter (HOSPITAL_COMMUNITY): Payer: Self-pay | Admitting: Family Medicine

## 2020-01-09 ENCOUNTER — Inpatient Hospital Stay (HOSPITAL_COMMUNITY): Payer: Medicare Other

## 2020-01-09 ENCOUNTER — Encounter (HOSPITAL_COMMUNITY)
Admission: EM | Disposition: A | Discharge status: Home Health | Payer: Self-pay | Source: Home / Self Care | Attending: Internal Medicine

## 2020-01-09 ENCOUNTER — Inpatient Hospital Stay (HOSPITAL_COMMUNITY): Payer: Medicare Other | Admitting: Anesthesiology

## 2020-01-09 HISTORY — PX: CYSTOSCOPY WITH STENT PLACEMENT: SHX5790

## 2020-01-09 LAB — RENAL FUNCTION PANEL
Albumin: 2.4 g/dL — ABNORMAL LOW (ref 3.5–5.0)
Anion gap: 13 (ref 5–15)
BUN: 127 mg/dL — ABNORMAL HIGH (ref 8–23)
CO2: 15 mmol/L — ABNORMAL LOW (ref 22–32)
Calcium: 7.8 mg/dL — ABNORMAL LOW (ref 8.9–10.3)
Chloride: 104 mmol/L (ref 98–111)
Creatinine, Ser: 7.06 mg/dL — ABNORMAL HIGH (ref 0.61–1.24)
GFR calc Af Amer: 7 mL/min — ABNORMAL LOW (ref >60)
GFR calc non Af Amer: 6 mL/min — ABNORMAL LOW (ref >60)
Glucose, Bld: 106 mg/dL — ABNORMAL HIGH (ref 70–99)
Phosphorus: 7.6 mg/dL — ABNORMAL HIGH (ref 2.5–4.6)
Potassium: 4.5 mmol/L (ref 3.5–5.1)
Sodium: 132 mmol/L — ABNORMAL LOW (ref 135–145)

## 2020-01-09 LAB — CBC
HCT: 29.6 % — ABNORMAL LOW (ref 39.0–52.0)
Hemoglobin: 10 g/dL — ABNORMAL LOW (ref 13.0–17.0)
MCH: 34.5 pg — ABNORMAL HIGH (ref 26.0–34.0)
MCHC: 33.8 g/dL (ref 30.0–36.0)
MCV: 102.1 fL — ABNORMAL HIGH (ref 80.0–100.0)
Platelets: 114 10*3/uL — ABNORMAL LOW (ref 150–400)
RBC: 2.9 MIL/uL — ABNORMAL LOW (ref 4.22–5.81)
RDW: 14.4 % (ref 11.5–15.5)
WBC: 11.9 10*3/uL — ABNORMAL HIGH (ref 4.0–10.5)
nRBC: 0 % (ref 0.0–0.2)

## 2020-01-09 SURGERY — CYSTOSCOPY, WITH STENT INSERTION
Anesthesia: General | Laterality: Bilateral

## 2020-01-09 MED ORDER — SODIUM CHLORIDE 0.9 % IV SOLN
2.0000 g | INTRAVENOUS | Status: AC
  Administered 2020-01-09 – 2020-01-10 (×3): 2 g via INTRAVENOUS
  Filled 2020-01-09 (×2): qty 2

## 2020-01-09 MED ORDER — DEXMEDETOMIDINE HCL IN NACL 200 MCG/50ML IV SOLN
INTRAVENOUS | Status: DC | PRN
  Administered 2020-01-09 (×2): 8 ug via INTRAVENOUS

## 2020-01-09 MED ORDER — PROPOFOL 10 MG/ML IV BOLUS
INTRAVENOUS | Status: DC | PRN
  Administered 2020-01-09: 60 mg via INTRAVENOUS

## 2020-01-09 MED ORDER — DEXAMETHASONE SODIUM PHOSPHATE 10 MG/ML IJ SOLN
INTRAMUSCULAR | Status: AC
  Filled 2020-01-09: qty 1

## 2020-01-09 MED ORDER — ONDANSETRON HCL 4 MG/2ML IJ SOLN
INTRAMUSCULAR | Status: DC | PRN
  Administered 2020-01-09: 4 mg via INTRAVENOUS

## 2020-01-09 MED ORDER — FENTANYL CITRATE (PF) 100 MCG/2ML IJ SOLN
INTRAMUSCULAR | Status: AC
  Filled 2020-01-09: qty 2

## 2020-01-09 MED ORDER — IOHEXOL 300 MG/ML  SOLN
INTRAMUSCULAR | Status: DC | PRN
  Administered 2020-01-09: 26 mL

## 2020-01-09 MED ORDER — ACETAMINOPHEN 500 MG PO TABS: 1000.0000 mg | ORAL_TABLET | Freq: Once | ORAL | Status: DC | PRN

## 2020-01-09 MED ORDER — FENTANYL CITRATE (PF) 100 MCG/2ML IJ SOLN: 25.0000 ug | INTRAMUSCULAR | Status: DC | PRN

## 2020-01-09 MED ORDER — ONDANSETRON HCL 4 MG/2ML IJ SOLN
INTRAMUSCULAR | Status: AC
  Filled 2020-01-09: qty 2

## 2020-01-09 MED ORDER — OXYCODONE HCL 5 MG/5ML PO SOLN: 5.0000 mg | Freq: Once | ORAL | Status: DC | PRN

## 2020-01-09 MED ORDER — LIDOCAINE HCL (CARDIAC) PF 100 MG/5ML IV SOSY
PREFILLED_SYRINGE | INTRAVENOUS | Status: DC | PRN
  Administered 2020-01-09: 80 mg via INTRAVENOUS

## 2020-01-09 MED ORDER — DEXAMETHASONE SODIUM PHOSPHATE 4 MG/ML IJ SOLN
INTRAMUSCULAR | Status: DC | PRN
  Administered 2020-01-09: 4 mg via INTRAVENOUS

## 2020-01-09 MED ORDER — LACTATED RINGERS IV SOLN: INTRAVENOUS | Status: DC

## 2020-01-09 MED ORDER — FENTANYL CITRATE (PF) 100 MCG/2ML IJ SOLN
50.0000 ug | INTRAMUSCULAR | Status: DC | PRN
  Administered 2020-01-09 (×2): 50 ug via INTRAVENOUS

## 2020-01-09 MED ORDER — FENTANYL CITRATE (PF) 100 MCG/2ML IJ SOLN
INTRAMUSCULAR | Status: DC | PRN
  Administered 2020-01-09 (×2): 25 ug via INTRAVENOUS
  Administered 2020-01-09: 50 ug via INTRAVENOUS

## 2020-01-09 MED ORDER — PHENYLEPHRINE HCL (PRESSORS) 10 MG/ML IV SOLN
INTRAVENOUS | Status: DC | PRN
  Administered 2020-01-09 (×5): 80 ug via INTRAVENOUS

## 2020-01-09 MED ORDER — OXYCODONE HCL 5 MG PO TABS: 5.0000 mg | ORAL_TABLET | Freq: Once | ORAL | Status: DC | PRN

## 2020-01-09 MED ORDER — WATER FOR IRRIGATION, STERILE IR SOLN
Status: DC | PRN
  Administered 2020-01-09: 3000 mL

## 2020-01-09 MED ORDER — ACETAMINOPHEN 160 MG/5ML PO SOLN: 1000.0000 mg | Freq: Once | ORAL | Status: DC | PRN

## 2020-01-09 MED ORDER — ACETAMINOPHEN 10 MG/ML IV SOLN: 1000.0000 mg | Freq: Once | INTRAVENOUS | Status: DC | PRN

## 2020-01-09 SURGICAL SUPPLY — 16 items
BAG DRN RND TRDRP ANRFLXCHMBR (UROLOGICAL SUPPLIES) ×1
BAG URINE DRAIN 2000ML AR STRL (UROLOGICAL SUPPLIES) ×2 IMPLANT
BAG URO CATCHER STRL LF (MISCELLANEOUS) ×2 IMPLANT
CATH FOLEY 2W COUNCIL 5CC 18FR (CATHETERS) ×2 IMPLANT
CATH URET 5FR 28IN OPEN ENDED (CATHETERS) ×2 IMPLANT
CLOTH BEACON ORANGE TIMEOUT ST (SAFETY) IMPLANT
GLOVE BIOGEL M STRL SZ7.5 (GLOVE) ×2 IMPLANT
GOWN STRL REUS W/TWL XL LVL3 (GOWN DISPOSABLE) ×2 IMPLANT
GUIDEWIRE STR DUAL SENSOR (WIRE) IMPLANT
GUIDEWIRE ZIPWRE .038 STRAIGHT (WIRE) ×2 IMPLANT
KIT TURNOVER KIT A (KITS) ×2 IMPLANT
MANIFOLD NEPTUNE II (INSTRUMENTS) ×2 IMPLANT
PACK CYSTO (CUSTOM PROCEDURE TRAY) ×2 IMPLANT
STENT URET 6FRX24 CONTOUR (STENTS) ×4 IMPLANT
SYR TOOMEY 50ML (SYRINGE) ×2 IMPLANT
TUBING CONNECTING 10 (TUBING) ×2 IMPLANT

## 2020-01-09 NOTE — Consult Note (Signed)
Renal Service Consult Note [facility]  Todd Wilkinson 01/09/2020 [name] Requesting Physician:  Dr [place]  Reason for Consult:  AKI HPI: The patient is a 86 y.o. year-old w/ hx of BPH, afib , HTN and HL presented for urinary retention w/ AKI and UTI.  Foley placed in ED returned 3L urine.  CT abd thereafter showed persistent bilat hydro and "debris" in both ureters.  UOP was poor after foley placement. Urology saw patient and took to OR today for placement of bilat ureteral stents. Asked to see for AKI.    Baseline creat from 2019 was 0.9.  Pt seen in room. No c/o's, feeling okay after surgery this am.  No sob, cough or CP. No fevers or chills. Has foley cath in place.  Denies hx kidney failure or dialysis.   ROS  denies CP  no joint pain   no HA  no blurry vision  no rash  no diarrhea  no nausea/ vomiting   Past Medical History  Past Medical History:  Diagnosis Date  . Hyperlipidemia    Past Surgical History  Past Surgical History:  Procedure Laterality Date  . BACK SURGERY     Family History  Family History  Problem Relation Age of Onset  . Cancer Mother   . Heart attack Father    Social History  reports that he quit smoking about 73 years ago. He has never used smokeless tobacco. He reports previous alcohol use. He reports that he does not use drugs. Allergies No Known Allergies Home medications Prior to Admission medications   Medication Sig Start Date End Date Taking? Authorizing Provider  b complex vitamins capsule Take 1 capsule by mouth daily.   Yes [provider]  ciprofloxacin (CIPRO) 500 MG tablet Take 500 mg by mouth 2 (two) times daily. 12/31/19 01/14/20 Yes [provider]  LORazepam (ATIVAN) 0.5 MG tablet Take 0.5-1 mg by mouth 2 (two) times daily.    Yes [provider]  LORazepam (ATIVAN) 1 MG tablet Take 1 mg by mouth every 8 (eight) hours as needed for anxiety.   Yes [provider]   losartan (COZAAR) 50 MG tablet Take 1 tablet (50 mg total) by mouth daily. 12/26/19 03/25/20 Yes Adrian Prows, MD  omeprazole (PRILOSEC) 20 MG capsule Take 20 mg by mouth 2 (two) times daily as needed.   Yes [provider]  pravastatin (PRAVACHOL) 80 MG tablet Take 1 tablet by mouth daily. 07/11/19  Yes [provider]  Rivaroxaban (XARELTO) 15 MG TABS tablet Take 1 tablet (15 mg total) by mouth daily with supper. 12/26/19  Yes Adrian Prows, MD  tamsulosin (FLOMAX) 0.4 MG CAPS Take 0.4 mg by mouth daily.   Yes [provider]     Vitals:   01/09/20 1345 01/09/20 1400 01/09/20 1415 01/09/20 1430  BP: (!) 98/50 (!) 85/43 (!) 91/53 114/68  Pulse: (!) 102 (!) 58 93 93  Resp: 20 (!) 9 20 17   Temp:    (!) 97.5 F (36.4 C)  TempSrc:      SpO2: 100% 100% 100% 100%  Weight:      Height:       Exam Gen alert No rash, cyanosis or gangrene Sclera anicteric, throat sig dry No jvd or bruits Chest clear bilat to bases RRR no MRG Abd soft ntnd no mass or ascites +bs GU normal male w/ foley in place  MS no joint effusions or deformity Ext no LE or UE  edema, no wounds or ulcers Neuro is alert, Ox 3 , nf    Home meds:  - losartan 50  - omeprazole / tamsulosin 0.4  - rivaroxaban 15 hs  - pravastatin 80  - prn ativan  - prn's/ vitamins/ supplements   UA turbid , orange, >50 rbc, 11-20 wbc , 0-5 epi, few bact, prot 100  US renal > R  12.7 cm = volume: 226 mL .Echo wnl no hydro. L 11.9cm, +mod L hydro, normal echo  CT abd/ pelvis > normal kidney size, mild bilat nephrolithiasis, mild bilat hydro w/ mild dilatation of ureters bilat; debris in the L prox ureter poss hemorrhagic material     Assessment/ Plan: 1. Renal failure - presumably all acute, creat was normal (0.98) in 2019.  Bilat hydro suggests obstructive pathology, also on ARB.  Urology has seen and placed bilat ureteral stents today.  Still appears dry on exam, agree w/ generous IVF's at 125 cc/ hr.  Will follow.  No indication for HD yet.  2. HTN - hold aRB/ acei for now. BP's soft, hold BP meds for now 3. Atrial fib  4. UTI      Kelly Splinter  MD 01/09/2020, 2:38 PM  Recent Labs  Lab 01/08/20 0215 01/09/20 0443  WBC 15.6* 11.9*  HGB 11.1* 10.0*   Recent Labs  Lab 01/07/20 1219 01/07/20 1219 01/08/20 0215 01/09/20 0443  K 4.2   < > 4.1 4.5  BUN 112*   < > 118* 127*  CREATININE 5.80*   < > 5.18* 7.06*  CALCIUM 8.4*  --  8.1* 7.8*  PHOS  --   --  6.5* 7.6*   < > = values in this interval not displayed.

## 2020-01-09 NOTE — Progress Notes (Signed)
PT Cancellation Note  Patient Details Name: Todd Wilkinson MRN: [id number] DOB: 27-Oct-1925   Cancelled Treatment:    Reason Eval/Treat Not Completed: Patient at procedure or test/unavailable--Will check back another day.    Doreatha Massed, PT Acute Rehabilitation

## 2020-01-09 NOTE — Transfer of Care (Signed)
Immediate Anesthesia Transfer of Care Note  Patient: [name]  Procedure(s) Performed: Procedure(s): CYSTOSCOPY WITH RETROGRADE PYELOGRAM AND STENT PLACEMENT (Bilateral)  Patient Location: PACU  Anesthesia Type:General  Level of Consciousness:  sedated, patient cooperative and responds to stimulation  Airway & Oxygen Therapy:Patient Spontanous Breathing and Patient connected to face mask oxgen  Post-op Assessment:  Report given to PACU RN and Post -op Vital signs reviewed and stable  Post vital signs:  Reviewed and stable  Last Vitals:  Vitals:   01/09/20 1139 01/09/20 1201  BP: 115/72 117/75  Pulse: 87 (!) 59  Resp: 16 20  Temp: 36.6 C 36.9 C  SpO2: 123456 [id number]    Complications: No apparent anesthesia complications

## 2020-01-09 NOTE — Progress Notes (Addendum)
PROGRESS NOTE    Todd Wilkinson  [id number] DOB: 10-15-1925 DOA: 01/07/2020 PCP: Deland Pretty, MD    Brief Narrative:   Todd Wilkinson is a 86 year old Caucasian male with past medical history remarkable for subtle atrial fibrillation, HLD, essential hypertension, chronic constipation, BPH who presented to the ED complaining of bladder discomfort x1 week.  He also states feels that he has trouble emptying his bladder.  Recently seen at urgent care 2 weeks ago, diagnosed with UTI and started on ciprofloxacin; and despite treatment, symptoms of hesitancy and dysuria continued.  He completed 7/14 days of ciprofloxacin and was taking Azo to help with symptoms; with change in his urine color to orange.  In the ED, temperature 97.5, HR 80, RR 17, BP 134/64, SPO2 90% on room air.  Sodium 133, CO2 17, BUN 112, creatinine 5.8, WBC count 16.9.  Renal ultrasound notable for moderate left hydronephrosis, 4.3 cm mass left lower quadrant described as possible enlarged lymph node.  Patient was given morphine, ceftriaxone, oxybutynin and referred for admission given acute renal failure, left hydronephrosis, left lower quadrant mass, and UTI for further treatment and evaluation.   Assessment & Plan:   Principal Problem:   AKI (acute kidney injury) ([place]) Active Problems:   AF (paroxysmal atrial fibrillation) ([facility])   Essential hypertension   Hyperlipidemia   BPH (benign prostatic hyperplasia)   Urinary tract infection   Abdominal mass, LLQ (left lower quadrant)   Acute renal failure Obstructive uropathy with Bilateral hydronephrosis Patient presenting with bladder pain and decreased urine output.  Creatinine noted to be elevated to 5.80 and BUN 112 with a previous baseline on review of EMR of 0.98 in October 2019.  Patient with history of BPH on tamsulosin.  Bladder scan in the ED notable for >976mL retained urine in Foley catheter was placed.  Renal ultrasound notable for moderate left  hydronephrosis with debris in renal pelvis.  CT abdomen/pelvis with mild symmetric hydronephrosis, no stones, hyperdense material left UPJ, proximal ureter and distal right ureter suggestive of of hemorrhagic material and bilateral perinephric stranding. --Cr 5.80-->5.18-->7.06 --BUN 112-->118-->127 --UOP past 24hr 142mL --continue IVF hydration with NS at 125 mL per hour --Continue to hold home ARB --Avoid nephrotoxins, renally dose all medications --Strict I's and O's --Urology consulted, discussed with Dr. Lovena Neighbours; concern for bilateral ureter obstruction secondary to blood clot, may need cystoscopy versus nephrostomy --Nephrology also consulted, discussed with Dr. Jonnie Finner, for additional recommendations --N.p.o. until urology evaluation  ADDENDUM: Patient going for cystoscopy with bilateral stent placement this afternoon per urology.  Urinary tract infection Pyelonephritis Was on ciprofloxacin completed 7/14 days of outpatient treatment.  Urinalysis with small leukoesterase, positive nitrite, few bacteria, greater than 50 RBCs, and 11-20 WBCs.  Urine culture with no growth.  CT abdomen/pelvis notable for bilateral perinephric stranding consistent with pyelonephritis. --Continue ceftriaxone  Left lower quadrant abdominal mass Renal ultrasound with 4.3 cm mass left lower quadrant, questionable enlarged lymph node. CT abdomen/pelvis without contrast notable for a 2.7 cm left periaortic lymph node  Paroxysmal atrial fibrillation On Xarelto 50 mg p.o. daily outpatient, not on any rate controlling agents. --holding home xarelto with renal function decline --Heparin drip discontinued on 01/08/2020 given likely blood clot within the ureter --Monitor on telemetry  Hyperlipidemia --holding home statin  Essential hypertension --Holding home losartan secondary to AKI --Hydralazine prn  BPH Review of EMR notable for PSA of 20 on 2019. --repeat PSA elevated to 72 --Continue  tamsulosin --Urology evaluation as above  Sundowning --Delirium  precautions, lights on during the day, off at night --Melatonin 3 mg p.o. daily --Trazodone 50 mg p.o. nightly   DVT prophylaxis: SCD's Code Status: DNR Family Communication: updated patients daughter Amy via telephone this morning Disposition Plan: Remain inpatient, continue IV fluids, close monitoring of renal function with strict I's and O's, CT abdomen/pelvis for evaluation of left hydronephrosis and left lower quadrant mass, awaiting PT/OT evaluation, from home, anticipate likely need of higher level of care on discharge   Consultants:   none  Procedures:   none  Antimicrobials:   Ceftriaxone 2/20>>   Subjective: Patient seen and examined at bedside, resting comfortably. Continues with confusion.  Now with poor urine output past 24 hours, urine dark/bloody, very small amount in Foley. Denies headache, no chest pain, no palpitations, no shortness of breath, no abdominal pain, no weakness, no fatigue.  No other acute events overnight per nursing staff.  Objective: Vitals:   01/08/20 1322 01/08/20 2049 01/08/20 2331 01/09/20 0529  BP: 100/65 115/76  (!) 102/46  Pulse: 84 62  66  Resp: 16 17  18   Temp: 98.3 F (36.8 C) 98.6 F (37 C)  98.1 F (36.7 C)  TempSrc: Oral Oral  Oral  SpO2: 98% 97%  97%  Weight:   74.5 kg   Height:   5\' 9"  (1.753 m)     Intake/Output Summary (Last 24 hours) at 01/09/2020 0839 Last data filed at 01/08/2020 2302 Gross per 24 hour  Intake 1673.1 ml  Output 160 ml  Net 1513.1 ml   Filed Weights   01/08/20 2331  Weight: 74.5 kg    Examination:  General exam: Appears calm and comfortable, pleasantly confused  Respiratory system: Clear to auscultation. Respiratory effort normal. Oxygenating well on room air Cardiovascular system: S1 & S2 heard, RRR. No JVD, murmurs, rubs, gallops or clicks. No pedal edema. Gastrointestinal system: Abdomen is nondistended, soft and  nontender. No organomegaly or masses felt. Normal bowel sounds heard. Central nervous system: Alert, oriented to name and time (2021), but not Place (don't) and Person (President "I can't stand that guy") . No focal neurological deficits. Extremities: Symmetric 5 x 5 power. Skin: No rashes, lesions or ulcers Psychiatry: Judgement and insight appear poor. Mood & affect appropriate.     Data Reviewed: I have personally reviewed following labs and imaging studies  CBC: Recent Labs  Lab 01/07/20 1219 01/08/20 0215 01/09/20 0443  WBC 16.9* 15.6* 11.9*  NEUTROABS 12.7*  --   --   HGB 11.6* 11.1* 10.0*  HCT 34.8* 32.2* 29.6*  MCV 101.5* 99.4 102.1*  PLT 136* 126* [id number]*   Basic Metabolic Panel: Recent Labs  Lab 01/07/20 1219 01/08/20 0215 01/09/20 0443  NA 133* 133* 132*  K 4.2 4.1 4.5  CL 101 102 104  CO2 17* 16* 15*  GLUCOSE 109* 106* 106*  BUN 112* 118* 127*  CREATININE 5.80* 5.18* 7.06*  CALCIUM 8.4* 8.1* 7.8*  PHOS  --  6.5* 7.6*   GFR: Estimated Creatinine Clearance: 6.7 mL/min (A) (by C-G formula based on SCr of 7.06 mg/dL (H)). Liver Function Tests: Recent Labs  Lab 01/07/20 1219 01/08/20 0215 01/09/20 0443  AST 22 18  --   ALT 25 22  --   ALKPHOS 76 72  --   BILITOT 4.1* 3.3*  --   PROT 6.4* 5.9*  --   ALBUMIN 3.1* 2.7*  2.8* 2.4*   No results for input(s): LIPASE, AMYLASE in the last 168 hours. No results for input(s):  AMMONIA in the last 168 hours. Coagulation Profile: Recent Labs  Lab 01/08/20 0215  INR 2.5*   Cardiac Enzymes: No results for input(s): CKTOTAL, CKMB, CKMBINDEX, TROPONINI in the last 168 hours. BNP (last 3 results) No results for input(s): PROBNP in the last 8760 hours. HbA1C: No results for input(s): HGBA1C in the last 72 hours. CBG: No results for input(s): GLUCAP in the last 168 hours. Lipid Profile: No results for input(s): CHOL, HDL, LDLCALC, TRIG, CHOLHDL, LDLDIRECT in the last 72 hours. Thyroid Function Tests: No  results for input(s): TSH, T4TOTAL, FREET4, T3FREE, THYROIDAB in the last 72 hours. Anemia Panel: No results for input(s): VITAMINB12, FOLATE, FERRITIN, TIBC, IRON, RETICCTPCT in the last 72 hours. Sepsis Labs: No results for input(s): PROCALCITON, LATICACIDVEN in the last 168 hours.  Recent Results (from the past 240 hour(s))  Urine culture     Status: None   Collection Time: 01/07/20 11:25 AM   Specimen: Urine, Random  Result Value Ref Range Status   Specimen Description   Final    URINE, RANDOM Performed at [facility] [address] [place], [place] [address]    Special Requests   Final    NONE Performed at [facility], [place] [address], [place], [place] [address]    Culture   Final    NO GROWTH Performed at [facility], [place] [address], [place], [place] [address]    Report Status 01/08/2020 FINAL  Final  SARS CORONAVIRUS 2 (TAT 6-24 HRS) Nasopharyngeal Nasopharyngeal Swab     Status: None   Collection Time: 01/07/20  2:56 PM   Specimen: Nasopharyngeal Swab  Result Value Ref Range Status   SARS Coronavirus 2 NEGATIVE NEGATIVE Final    Comment: (NOTE) SARS-CoV-2 target nucleic acids are NOT DETECTED. The SARS-CoV-2 RNA is generally detectable in upper and lower respiratory specimens during the acute phase of infection. Negative results do not preclude SARS-CoV-2 infection, do not rule out co-infections with other pathogens, and should not be used as the sole basis for treatment or other patient management decisions. Negative results must be combined with clinical observations, patient history, and epidemiological information. The expected result is Negative. Fact Sheet for Patients: SugarRoll.be Fact Sheet for Healthcare Providers: https://www.woods-mathews.com/ This test is not yet approved or cleared by the [place] FDA and  has been authorized for detection and/or diagnosis of  SARS-CoV-2 by FDA under an Emergency Use Authorization (EUA). This EUA will remain  in effect (meaning this test can be used) for the duration of the COVID-19 declaration under Section 56 4(b)(1) of the Act, 21 U.S.C. section 360bbb-3(b)(1), unless the authorization is terminated or revoked sooner. Performed at [facility], [place] [address] [place], [place] [address]          Radiology Studies: CT ABDOMEN PELVIS WO CONTRAST  Result Date: 01/08/2020 CLINICAL DATA:  Left lower quadrant/pelvic mass with left-sided hydronephrosis and acute renal insufficiency. EXAM: CT ABDOMEN AND PELVIS WITHOUT CONTRAST TECHNIQUE: Multidetector CT imaging of the abdomen and pelvis was performed following the standard protocol without IV contrast. COMPARISON:  Lumbar spine CT 05/27/2013 FINDINGS: Lower chest: Subtle peripheral interstitial thickening in the lung bases. No lobar consolidation or effusion. Mild cardiomegaly. Calcified plaque over the descending thoracic aorta. Hepatobiliary: Gallbladder is contracted. Liver and biliary tree are normal. Pancreas: Normal. Spleen: Normal. Adrenals/Urinary Tract: Adrenal glands are normal. Kidneys are normal in size and demonstrate mild bilateral nephrolithiasis as well as a few arterial calcifications near  the renal hilum. There is mild bilateral symmetric hydronephrosis with mild dilatation of the ureters. There is increased density over the region of the left UPJ/proximal left ureter likely hemorrhagic material. Symmetric bilateral perinephric stranding/fluid. Mild hyperdensity filling the distal right ureter which again suggest hemorrhagic material. Foley catheter is present within the bladder. There are a few foci of air within the bladder likely from recent instrumentation. There is also possible mild increased density over the anterior aspect of the bladder. Stomach/Bowel: Suggestion of small sliding hiatal hernia. Stomach is otherwise unremarkable. Small bowel  is normal. Minimal diverticulosis of the colon which is otherwise unremarkable. Cecum is located within the midline upper pelvis. Appendix is normal. Vascular/Lymphatic: Moderate calcified plaque over the abdominal aorta which is normal in caliber. Prominent left periaortic lymph node measuring 2.7 cm by short axis at the level of the aortic bifurcation. No other significant adenopathy visualized. Reproductive: Mild prostatic enlargement. Other: Mild free fluid over the pericolic gutters, retroperitoneum and pelvis. Musculoskeletal: Degenerative change of the spine and hips. Posterior fusion hardware intact from L2-S1. IMPRESSION: 1. Foley catheter in adequate position over the bladder. Mild symmetric bilateral hydronephrosis. Mild bilateral nephrolithiasis. No ureteral stones present. Hyperdense material over the left UPJ and proximal ureter as well as over the distal right ureter suggesting hemorrhagic material. Mild ascites. 2. Abnormal left periaortic lymph node at the level of the aortic bifurcation measuring 2.7 cm by short axis. 3.  Mild prostatic enlargement. 4.  Aortic Atherosclerosis (ICD10-I70.0).  Mild cardiomegaly. Electronically Signed   By: Marin Olp M.D.   On: 01/08/2020 10:15   US Renal  Result Date: 01/07/2020 CLINICAL DATA:  Acute kidney injury EXAM: RENAL / URINARY TRACT ULTRASOUND COMPLETE COMPARISON:  None. FINDINGS: Right Kidney: Renal measurements: 12.7 x 5.5 x 6.2 cm = volume: 226 mL . Echogenicity within normal limits. No mass or hydronephrosis visualized. Left Kidney: Renal measurements: 11.9 x 5.9 x 6.3 cm = volume: 231 mL. There is moderate hydronephrosis with debris in the renal pelvis. Bladder: There is a Foley catheter within the urinary bladder. Other: Hypoechoic mass in the left lower quadrant measuring 4.3 x 2.3 x 4.3 cm, possibly a lymph node. IMPRESSION: 1. Moderate left hydronephrosis with debris in the renal pelvis. 2. 4.3 cm mass in the left lower quadrant, possibly a  enlarged lymph node. 3. CT of the abdomen and pelvis is recommended for further evaluation. Electronically Signed   By: Ulyses Jarred M.D.   On: 01/07/2020 15:03   DG Abd Portable 1V  Result Date: 01/07/2020 CLINICAL DATA:  Bladder pain 1 week.  Difficulty emptying bladder. EXAM: PORTABLE ABDOMEN - 1 VIEW COMPARISON:  None. FINDINGS: Catheter present over the midline pelvis likely Foley catheter. There are several air-filled nondilated large and small bowel loops. No free peritoneal air. Degenerative change of the spine. Fusion hardware intact over the lumbosacral spine from L2 to S1. IMPRESSION: Nonspecific, nonobstructive bowel gas pattern with several air-filled nondilated large and small bowel loops. Electronically Signed   By: Marin Olp M.D.   On: 01/07/2020 16:17   US Abdomen Limited RUQ  Result Date: 01/08/2020 CLINICAL DATA:  Hyperbilirubinemia EXAM: ULTRASOUND ABDOMEN LIMITED RIGHT UPPER QUADRANT COMPARISON:  None. FINDINGS: Gallbladder: No gallstones or wall thickening visualized. No sonographic Murphy sign noted by sonographer. Common bile duct: Diameter: 3 mm Liver: No focal lesion identified. Within normal limits in parenchymal echogenicity. Portal vein is patent on color Doppler imaging with normal direction of blood flow towards the liver. Other: Distended  IVC that is not changed with respiration per report. Visualization of the right kidney specifically performed on ultrasound yesterday. IMPRESSION: 1. Negative sonography of the liver and gallbladder. 2. Distended IVC, are there symptoms of right heart failure? Electronically Signed   By: Monte Fantasia M.D.   On: 01/08/2020 08:06        Scheduled Meds: . B-complex with vitamin C  1 tablet Oral Daily  . Chlorhexidine Gluconate Cloth  6 each Topical Daily  . LORazepam  0.5-1 mg Oral BID  . Melatonin  3 mg Oral QHS  . polyethylene glycol  17 g Oral Daily  . tamsulosin  0.4 mg Oral Daily  . traZODone  50 mg Oral QHS    Continuous Infusions: . sodium chloride 125 mL/hr at 01/09/20 0000  . cefTRIAXone (ROCEPHIN)  IV       LOS: 2 days    Time spent: 39 minutes spent on chart review, discussion with nursing staff, consultants, updating family and interview/physical exam; more than 50% of that time was spent in counseling and/or coordination of care.    Aiden Helzer J [place] DO [facility] Available via Epic secure chat 7am-7pm After these hours, please refer to coverage provider listed on [email or web address] 01/09/2020, 8:39 AM

## 2020-01-09 NOTE — Op Note (Signed)
Operative Note  Preoperative diagnosis:  1.  Bilateral hydronephrosis 2.  Acute urinary retention 3.  Acute renal failure  Postoperative diagnosis: 1.  Bilateral hydronephrosis 2.  Acute urinary retention 3.  Acute renal failure  Procedure(s): 1.  Cystoscopy with bilateral JJ stent placement 2.  Bilateral retrograde pyelograms with intraoperative interpretation of fluoroscopic imaging  Surgeon: Ellison Hughs, MD  Assistants:  None  Anesthesia:  General  Complications:  None  EBL: None  Specimens: 1.  Urine for culture and sensitivity  Drains/Catheters: 1.  Bilateral 6 French, 24 cm JJ stents without tethers 2.  18 French council tip catheter with 10 mL in the balloon  Intraoperative findings:   1. Markedly enlarged prostate with prominent median lobe 2. Moderate bladder trabeculation 3. Clot debris was expressed from both ureteral orifices following wire manipulation of the upper urinary tract 4. Three 1 cm bladder stones 5. Right retrograde pyelogram revealed a tortuous right ureter with a dilated right renal pelvis, but no other filling defects were noted along the upper urinary tract 6. Left retrograde pyelogram revealed a tortuous left ureter with a filling defect within the left renal pelvis measuring approximately 2 cm along with dilation of the left renal pelvis  Indication:  Todd Wilkinson is a 86 y.o. male with bilateral hydronephrosis, acute urinary retention and acute renal failure.  CT abdomen and pelvis from 01/08/2020 revealed persistent bilateral hydronephrosis with debris within the ureteral lumen at multiple points.  Despite Foley catheter decompression of the bladder and IV fluid resuscitation, the patient's renal function continued to decline.  He has been consented for the above procedures, voices understanding and wishes to proceed.  Description of procedure:  After informed consent was obtained, the patient was brought to the operating room and  general LMA anesthesia was administered. The patient was then placed in the dorsolithotomy position and prepped and draped in the usual sterile fashion. A timeout was performed. A 23 French rigid cystoscope was then inserted into the urethral meatus and advanced into the bladder under direct vision. A complete bladder survey revealed the findings listed above.  A 5 French ureteral catheter was then inserted into the right ureteral orifice and a retrograde pyelogram was obtained, with the findings listed above.  A Glidewire was then used to intubate the lumen of the ureteral catheter and was advanced up to the right renal pelvis, under fluoroscopic guidance.  The catheter was then removed, leaving the wire in place.  A 6 French, 24 cm JJ stent was then advanced over the wire and into good position within the right collecting system, confirming placement via fluoroscopy.  Following stent placement, there was a large amount of clot debris expressed from the right ureteral orifice and from around the stent.  A 5 French ureteral catheter was then inserted into the left ureteral orifice and a retrograde pyelogram was obtained, with the findings listed above.  A Glidewire was then used to intubate the lumen of the ureteral catheter and was advanced up to the left renal pelvis, under fluoroscopic guidance.  The catheter was then removed, leaving the wire in place.  A 6 French, 24 cm JJ stent was then advanced over the wire and into good position within the left collecting system, confirming placement via fluoroscopy.  He again had what appeared to be clot debris expressed from the left ureteral orifice following wire manipulation and stent placement.  His 3 bladder stones were then evacuated through the cystoscope sheath.  A Glidewire was  then advanced back into the bladder and the cystoscope was removed.  An 62 [place] council tip catheter was then advanced over the wire and into good position within the bladder,  confirming placement with hand irrigation.  10 mL of sterile water was used to inflate the catheter balloon, which was then placed to gravity drainage.  He tolerated the procedure well and was transferred to the postanesthesia in stable condition.  Plan: Continue IV fluid resuscitation and monitor renal function.  Continue tamsulosin.  Recommend voiding trial in 3 days.  Will arrange outpatient follow-up for stent removal and repeat CT to assess for clearance of the debris within his bilateral ureters noted on his CT from 01/08/2020.

## 2020-01-09 NOTE — Anesthesia Preprocedure Evaluation (Addendum)
Anesthesia Evaluation  Patient identified by MRN, date of birth, ID band Patient confused    Reviewed: Allergy & Precautions, NPO status , Patient's Chart, lab work & pertinent test results, Unable to perform ROS - Chart review only  History of Anesthesia Complications Negative for: history of anesthetic complications  Airway Mallampati: II  TM Distance: >3 FB Neck ROM: Full    Dental  (+) Dental Advisory Given   Pulmonary neg recent URI, former smoker,    breath sounds clear to auscultation       Cardiovascular hypertension, Pt. on medications + dysrhythmias Atrial Fibrillation  Rhythm:Irregular     Neuro/Psych negative neurological ROS  negative psych ROS   GI/Hepatic GERD  Medicated,  Endo/Other    Renal/GU ARFRenal disease     Musculoskeletal   Abdominal   Peds  Hematology  (+) anemia , On blood thinners INR 2.5   Anesthesia Other Findings   Reproductive/Obstetrics                           Anesthesia Physical Anesthesia Plan  ASA: IV  Anesthesia Plan: General   Post-op Pain Management:    Induction: Intravenous  PONV Risk Score and Plan: 2 and Ondansetron and Dexamethasone  Airway Management Planned: LMA  Additional Equipment: None  Intra-op Plan:   Post-operative Plan: Extubation in OR  Informed Consent: I have reviewed the patients History and Physical, chart, labs and discussed the procedure including the risks, benefits and alternatives for the proposed anesthesia with the patient or authorized representative who has indicated his/her understanding and acceptance.     Dental advisory given  Plan Discussed with: CRNA and Surgeon  Anesthesia Plan Comments:         Anesthesia Quick Evaluation

## 2020-01-09 NOTE — Progress Notes (Signed)
OT Cancellation Note  Patient Details Name: Todd Wilkinson MRN: [id number] DOB: 1925-05-28   Cancelled Treatment:    Reason Eval/Treat Not Completed: Patient at procedure or test/ unavailable  Patient at procedure or test/unavailable--Will check back next day Kari Baars, [place] [phone] Office- [phone], Thereasa Parkin 01/09/2020, 12:57 PM

## 2020-01-09 NOTE — Progress Notes (Addendum)
Bladder scan completed = 2 ml shown Foley flushed with several small clots returned.  Dr. Gilford Rile on unit and notified.

## 2020-01-09 NOTE — Consult Note (Signed)
Urology Consult   Physician requesting consult: Eric [place] DO  Reason for consult: Bilateral ureteral obstruction and acute renal failure  History of Present Illness: Todd Wilkinson is a 86 y.o. male with a history of BPH, A-fib, HTN, HLD.  He is currently being treated for acute urinary retention, acute renal failure (baseline creatinine <1.0), left pelvic mass/lymph node and UTI.  RUS done at admission showed bilateral hydronephrosis and a distended bladder with >1000 mL.  A foley catheter was placed in the ED with return of ~3 L of urine.  Subsequent CT of the abd/pel revealed persistent bilateral hydronephrosis with "hyerperdense debris" in the distal left ureter, left UPJ and distal right ureter suggesting hemorrhagic material. He currently has a Foley catheter in place with minimal urine output for the past 24 hours, despite IVF.    The patient denies a history of  hematuria, UTIs, STDs, urolithiasis, GU malignancy/trauma/surgery.  Past Medical History:  Diagnosis Date  . Hyperlipidemia     Past Surgical History:  Procedure Laterality Date  . BACK SURGERY      Current Hospital Medications:  Home Meds:  Current Meds  Medication Sig  . b complex vitamins capsule Take 1 capsule by mouth daily.  . ciprofloxacin (CIPRO) 500 MG tablet Take 500 mg by mouth 2 (two) times daily.  Marland Kitchen LORazepam (ATIVAN) 0.5 MG tablet Take 0.5-1 mg by mouth 2 (two) times daily.   Marland Kitchen LORazepam (ATIVAN) 1 MG tablet Take 1 mg by mouth every 8 (eight) hours as needed for anxiety.  Marland Kitchen losartan (COZAAR) 50 MG tablet Take 1 tablet (50 mg total) by mouth daily.  Marland Kitchen omeprazole (PRILOSEC) 20 MG capsule Take 20 mg by mouth 2 (two) times daily as needed.  . pravastatin (PRAVACHOL) 80 MG tablet Take 1 tablet by mouth daily.  . Rivaroxaban (XARELTO) 15 MG TABS tablet Take 1 tablet (15 mg total) by mouth daily with supper.  . tamsulosin (FLOMAX) 0.4 MG CAPS Take 0.4 mg by mouth daily.    Scheduled Meds: . B-complex  with vitamin C  1 tablet Oral Daily  . Chlorhexidine Gluconate Cloth  6 each Topical Daily  . LORazepam  0.5-1 mg Oral BID  . Melatonin  3 mg Oral QHS  . polyethylene glycol  17 g Oral Daily  . tamsulosin  0.4 mg Oral Daily  . traZODone  50 mg Oral QHS   Continuous Infusions: . sodium chloride 125 mL/hr at 01/09/20 0000  . cefTRIAXone (ROCEPHIN)  IV     PRN Meds:.haloperidol lactate, traMADol  Allergies: No Known Allergies  Family History  Problem Relation Age of Onset  . Cancer Mother   . Heart attack Father     Social History:  reports that he quit smoking about 73 years ago. He has never used smokeless tobacco. He reports previous alcohol use. He reports that he does not use drugs.  ROS: A complete review of systems was performed.  All systems are negative except for pertinent findings as noted.  Physical Exam:  Vital signs in last 24 hours: Temp:  [98.1 F (36.7 C)-98.6 F (37 C)] 98.1 F (36.7 C) (02/22 0529) Pulse Rate:  [62-84] 66 (02/22 0529) Resp:  [16-18] 18 (02/22 0529) BP: (100-115)/(46-76) 102/46 (02/22 0529) SpO2:  [97 %-98 %] 97 % (02/22 0529) Weight:  [74.5 kg] 74.5 kg (02/21 2331) Constitutional:  Alert and oriented, No acute distress Cardiovascular: Regular rate and rhythm, No JVD Respiratory: Normal respiratory effort, Lungs clear bilaterally GI: Abdomen is  soft, nontender, nondistended, no abdominal masses GU: No CVA tenderness, 4F Foley in place with bloody output Lymphatic: No lymphadenopathy Neurologic: Grossly intact, no focal deficits Psychiatric: Normal mood and affect  Laboratory Data:  Recent Labs    01/07/20 1219 01/08/20 0215 01/09/20 0443  WBC 16.9* 15.6* 11.9*  HGB 11.6* 11.1* 10.0*  HCT 34.8* 32.2* 29.6*  PLT 136* 126* 114*    Recent Labs    01/07/20 1219 01/08/20 0215 01/09/20 0443  NA 133* 133* 132*  K 4.2 4.1 4.5  CL 101 102 104  GLUCOSE 109* 106* 106*  BUN 112* 118* 127*  CALCIUM 8.4* 8.1* 7.8*  CREATININE  5.80* 5.18* 7.06*     Results for orders placed or performed during the hospital encounter of 01/07/20 (from the past 24 hour(s))  APTT     Status: Abnormal   Collection Time: 01/08/20  1:51 PM  Result Value Ref Range   aPTT 72 (H) 24 - 36 seconds  Renal function panel     Status: Abnormal   Collection Time: 01/09/20  4:43 AM  Result Value Ref Range   Sodium 132 (L) 135 - 145 mmol/L   Potassium 4.5 3.5 - 5.1 mmol/L   Chloride 104 98 - 111 mmol/L   CO2 15 (L) 22 - 32 mmol/L   Glucose, Bld 106 (H) 70 - 99 mg/dL   BUN 127 (H) 8 - 23 mg/dL   Creatinine, Ser 7.06 (H) 0.61 - 1.24 mg/dL   Calcium 7.8 (L) 8.9 - 10.3 mg/dL   Phosphorus 7.6 (H) 2.5 - 4.6 mg/dL   Albumin 2.4 (L) 3.5 - 5.0 g/dL   GFR calc non Af Amer 6 (L) >60 mL/min   GFR calc Af Amer 7 (L) >60 mL/min   Anion gap 13 5 - 15  CBC     Status: Abnormal   Collection Time: 01/09/20  4:43 AM  Result Value Ref Range   WBC 11.9 (H) 4.0 - 10.5 K/uL   RBC 2.90 (L) 4.22 - 5.81 MIL/uL   Hemoglobin 10.0 (L) 13.0 - 17.0 g/dL   HCT 29.6 (L) 39.0 - 52.0 %   MCV 102.1 (H) 80.0 - 100.0 fL   MCH 34.5 (H) 26.0 - 34.0 pg   MCHC 33.8 30.0 - 36.0 g/dL   RDW 14.4 11.5 - 15.5 %   Platelets 114 (L) 150 - 400 K/uL   nRBC 0.0 0.0 - 0.2 %   Recent Results (from the past 240 hour(s))  Urine culture     Status: None   Collection Time: 01/07/20 11:25 AM   Specimen: Urine, Random  Result Value Ref Range Status   Specimen Description   Final    URINE, RANDOM Performed at [facility], [address], [place], [place] [address]    Special Requests   Final    NONE Performed at [facility] [place] [address], [place], [place] [address]    Culture   Final    NO GROWTH Performed at [facility], [place] [address] [place], [place] [address]    Report Status 01/08/2020 FINAL  Final  SARS CORONAVIRUS 2 (TAT 6-24 HRS) Nasopharyngeal Nasopharyngeal Swab     Status: None   Collection Time: 01/07/20  2:56 PM    Specimen: Nasopharyngeal Swab  Result Value Ref Range Status   SARS Coronavirus 2 NEGATIVE NEGATIVE Final    Comment: (NOTE) SARS-CoV-2 target nucleic acids are NOT DETECTED. The SARS-CoV-2 RNA is generally detectable in  upper and lower respiratory specimens during the acute phase of infection. Negative results do not preclude SARS-CoV-2 infection, do not rule out co-infections with other pathogens, and should not be used as the sole basis for treatment or other patient management decisions. Negative results must be combined with clinical observations, patient history, and epidemiological information. The expected result is Negative. Fact Sheet for Patients: SugarRoll.be Fact Sheet for Healthcare Providers: https://www.woods-mathews.com/ This test is not yet approved or cleared by the [place] FDA and  has been authorized for detection and/or diagnosis of SARS-CoV-2 by FDA under an Emergency Use Authorization (EUA). This EUA will remain  in effect (meaning this test can be used) for the duration of the COVID-19 declaration under Section 56 4(b)(1) of the Act, 21 U.S.C. section 360bbb-3(b)(1), unless the authorization is terminated or revoked sooner. Performed at [facility], [place] [address], [place], [place] [address]     Renal Function: Recent Labs    01/07/20 1219 01/08/20 0215 01/09/20 0443  CREATININE 5.80* 5.18* 7.06*   Estimated Creatinine Clearance: 6.7 mL/min (A) (by C-G formula based on SCr of 7.06 mg/dL (H)).  Radiologic Imaging: CT ABDOMEN PELVIS WO CONTRAST  Result Date: 01/08/2020 CLINICAL DATA:  Left lower quadrant/pelvic mass with left-sided hydronephrosis and acute renal insufficiency. EXAM: CT ABDOMEN AND PELVIS WITHOUT CONTRAST TECHNIQUE: Multidetector CT imaging of the abdomen and pelvis was performed following the standard protocol without IV contrast. COMPARISON:  Lumbar spine CT 05/27/2013 FINDINGS:  Lower chest: Subtle peripheral interstitial thickening in the lung bases. No lobar consolidation or effusion. Mild cardiomegaly. Calcified plaque over the descending thoracic aorta. Hepatobiliary: Gallbladder is contracted. Liver and biliary tree are normal. Pancreas: Normal. Spleen: Normal. Adrenals/Urinary Tract: Adrenal glands are normal. Kidneys are normal in size and demonstrate mild bilateral nephrolithiasis as well as a few arterial calcifications near the renal hilum. There is mild bilateral symmetric hydronephrosis with mild dilatation of the ureters. There is increased density over the region of the left UPJ/proximal left ureter likely hemorrhagic material. Symmetric bilateral perinephric stranding/fluid. Mild hyperdensity filling the distal right ureter which again suggest hemorrhagic material. Foley catheter is present within the bladder. There are a few foci of air within the bladder likely from recent instrumentation. There is also possible mild increased density over the anterior aspect of the bladder. Stomach/Bowel: Suggestion of small sliding hiatal hernia. Stomach is otherwise unremarkable. Small bowel is normal. Minimal diverticulosis of the colon which is otherwise unremarkable. Cecum is located within the midline upper pelvis. Appendix is normal. Vascular/Lymphatic: Moderate calcified plaque over the abdominal aorta which is normal in caliber. Prominent left periaortic lymph node measuring 2.7 cm by short axis at the level of the aortic bifurcation. No other significant adenopathy visualized. Reproductive: Mild prostatic enlargement. Other: Mild free fluid over the pericolic gutters, retroperitoneum and pelvis. Musculoskeletal: Degenerative change of the spine and hips. Posterior fusion hardware intact from L2-S1. IMPRESSION: 1. Foley catheter in adequate position over the bladder. Mild symmetric bilateral hydronephrosis. Mild bilateral nephrolithiasis. No ureteral stones present. Hyperdense  material over the left UPJ and proximal ureter as well as over the distal right ureter suggesting hemorrhagic material. Mild ascites. 2. Abnormal left periaortic lymph node at the level of the aortic bifurcation measuring 2.7 cm by short axis. 3.  Mild prostatic enlargement. 4.  Aortic Atherosclerosis (ICD10-I70.0).  Mild cardiomegaly. Electronically Signed   By: Marin Olp M.D.   On: 01/08/2020 10:15   US Renal  Result Date: 01/07/2020 CLINICAL DATA:  Acute  kidney injury EXAM: RENAL / URINARY TRACT ULTRASOUND COMPLETE COMPARISON:  None. FINDINGS: Right Kidney: Renal measurements: 12.7 x 5.5 x 6.2 cm = volume: 226 mL . Echogenicity within normal limits. No mass or hydronephrosis visualized. Left Kidney: Renal measurements: 11.9 x 5.9 x 6.3 cm = volume: 231 mL. There is moderate hydronephrosis with debris in the renal pelvis. Bladder: There is a Foley catheter within the urinary bladder. Other: Hypoechoic mass in the left lower quadrant measuring 4.3 x 2.3 x 4.3 cm, possibly a lymph node. IMPRESSION: 1. Moderate left hydronephrosis with debris in the renal pelvis. 2. 4.3 cm mass in the left lower quadrant, possibly a enlarged lymph node. 3. CT of the abdomen and pelvis is recommended for further evaluation. Electronically Signed   By: Ulyses Jarred M.D.   On: 01/07/2020 15:03   DG Abd Portable 1V  Result Date: 01/07/2020 CLINICAL DATA:  Bladder pain 1 week.  Difficulty emptying bladder. EXAM: PORTABLE ABDOMEN - 1 VIEW COMPARISON:  None. FINDINGS: Catheter present over the midline pelvis likely Foley catheter. There are several air-filled nondilated large and small bowel loops. No free peritoneal air. Degenerative change of the spine. Fusion hardware intact over the lumbosacral spine from L2 to S1. IMPRESSION: Nonspecific, nonobstructive bowel gas pattern with several air-filled nondilated large and small bowel loops. Electronically Signed   By: Marin Olp M.D.   On: 01/07/2020 16:17   US Abdomen  Limited RUQ  Result Date: 01/08/2020 CLINICAL DATA:  Hyperbilirubinemia EXAM: ULTRASOUND ABDOMEN LIMITED RIGHT UPPER QUADRANT COMPARISON:  None. FINDINGS: Gallbladder: No gallstones or wall thickening visualized. No sonographic Murphy sign noted by sonographer. Common bile duct: Diameter: 3 mm Liver: No focal lesion identified. Within normal limits in parenchymal echogenicity. Portal vein is patent on color Doppler imaging with normal direction of blood flow towards the liver. Other: Distended IVC that is not changed with respiration per report. Visualization of the right kidney specifically performed on ultrasound yesterday. IMPRESSION: 1. Negative sonography of the liver and gallbladder. 2. Distended IVC, are there symptoms of right heart failure? Electronically Signed   By: Monte Fantasia M.D.   On: 01/08/2020 08:06    I independently reviewed the above imaging studies.  Impression/Recommendation 86 year old male with acute urinary retention, bilateral hydronephrosis, gross hematuria and acute renal failure  -Foley catheter flushed multiple times with return of only a few small blood clots.   -The risks, benefits and alternatives of cystoscopy with BILATERAL JJ stent placement was discussed with the patient.  Risks include, but are not limited to: bleeding, urinary tract infection, ureteral injury, ureteral stricture disease, chronic pain, urinary symptoms, bladder injury, stent migration, the need for nephrostomy tube placement, MI, CVA, DVT, PE and the inherent risks with general anesthesia.  The patient voices understanding and wishes to proceed.  -CT reviewed with the patient and his daughter Todd Wilkinson.  They both agreed to proceed with bilateral stent placement to address his bilateral hydronephrosis.    Ellison Hughs, MD [facility] 01/09/2020, 9:53 AM

## 2020-01-09 NOTE — Anesthesia Procedure Notes (Signed)
Procedure Name: LMA Insertion Date/Time: 01/09/2020 12:49 PM Performed by: Lavina Hamman, CRNA Pre-anesthesia Checklist: Patient identified, Emergency Drugs available, Suction available and Patient being monitored Patient Re-evaluated:Patient Re-evaluated prior to induction Oxygen Delivery Method: Circle System Utilized Preoxygenation: Pre-oxygenation with 100% oxygen Induction Type: IV induction Ventilation: Mask ventilation without difficulty LMA: LMA inserted LMA Size: 4.0 Number of attempts: 1 Airway Equipment and Method: Bite block Placement Confirmation: positive ETCO2 Tube secured with: Tape Dental Injury: Teeth and Oropharynx as per pre-operative assessment

## 2020-01-10 ENCOUNTER — Encounter: Payer: Self-pay | Admitting: *Deleted

## 2020-01-10 LAB — RENAL FUNCTION PANEL
Albumin: 2.4 g/dL — ABNORMAL LOW (ref 3.5–5.0)
Anion gap: 11 (ref 5–15)
BUN: 110 mg/dL — ABNORMAL HIGH (ref 8–23)
CO2: 15 mmol/L — ABNORMAL LOW (ref 22–32)
Calcium: 8.3 mg/dL — ABNORMAL LOW (ref 8.9–10.3)
Chloride: 110 mmol/L (ref 98–111)
Creatinine, Ser: 4.81 mg/dL — ABNORMAL HIGH (ref 0.61–1.24)
GFR calc Af Amer: 11 mL/min — ABNORMAL LOW (ref >60)
GFR calc non Af Amer: 10 mL/min — ABNORMAL LOW (ref >60)
Glucose, Bld: 131 mg/dL — ABNORMAL HIGH (ref 70–99)
Phosphorus: 6.8 mg/dL — ABNORMAL HIGH (ref 2.5–4.6)
Potassium: 4.8 mmol/L (ref 3.5–5.1)
Sodium: 136 mmol/L (ref 135–145)

## 2020-01-10 LAB — CBC
HCT: 29.5 % — ABNORMAL LOW (ref 39.0–52.0)
Hemoglobin: 10 g/dL — ABNORMAL LOW (ref 13.0–17.0)
MCH: 34.7 pg — ABNORMAL HIGH (ref 26.0–34.0)
MCHC: 33.9 g/dL (ref 30.0–36.0)
MCV: 102.4 fL — ABNORMAL HIGH (ref 80.0–100.0)
Platelets: 115 10*3/uL — ABNORMAL LOW (ref 150–400)
RBC: 2.88 MIL/uL — ABNORMAL LOW (ref 4.22–5.81)
RDW: 14.6 % (ref 11.5–15.5)
WBC: 6.9 10*3/uL (ref 4.0–10.5)
nRBC: 0 % (ref 0.0–0.2)

## 2020-01-10 LAB — URINE CULTURE: Culture: NO GROWTH

## 2020-01-10 MED ORDER — TAMSULOSIN HCL 0.4 MG PO CAPS
0.4000 mg | ORAL_CAPSULE | Freq: Two times a day (BID) | ORAL | Status: DC
  Administered 2020-01-10 – 2020-01-15 (×10): 0.4 mg via ORAL
  Filled 2020-01-10 (×11): qty 1

## 2020-01-10 MED ORDER — SODIUM CHLORIDE 0.9 % IV SOLN
2.0000 g | INTRAVENOUS | Status: AC
  Administered 2020-01-11 – 2020-01-13 (×3): 2 g via INTRAVENOUS
  Filled 2020-01-10: qty 2
  Filled 2020-01-10: qty 20
  Filled 2020-01-10: qty 2

## 2020-01-10 MED ORDER — FINASTERIDE 5 MG PO TABS
5.0000 mg | ORAL_TABLET | Freq: Every day | ORAL | Status: DC
  Administered 2020-01-10 – 2020-01-15 (×6): 5 mg via ORAL
  Filled 2020-01-10 (×7): qty 1

## 2020-01-10 NOTE — Care Management Important Message (Signed)
Important Message  Patient Details IM Letter given to Dessa Phi RN Case Manager to present to the Patient Name: Todd Wilkinson MRN: [id number] Date of Birth: Oct 18, 1925   Medicare Important Message Given:  Yes     Kerin Salen 01/10/2020, 10:28 AM

## 2020-01-10 NOTE — Progress Notes (Signed)
Todd Wilkinson Progress Note  Subjective: creat down to 4.8 today, UOP picking up  Vitals:   01/09/20 1953 01/09/20 2343 01/10/20 0519 01/10/20 1317  BP: 135/74 (!) 119/51 (!) 142/101 (!) 112/58  Pulse: 81 62 76 (!) 49  Resp: 20 18 20 18   Temp: (!) 97.4 F (36.3 C) 97.6 F (36.4 C) (!) 97.5 F (36.4 C) (!) 97.2 F (36.2 C)  TempSrc:  Oral Oral Oral  SpO2: 99% 98% 98% 99%  Weight:      Height:        Exam: Gen alert Sclera anicteric, throat sig dry No jvd or bruits Chest clear bilat to bases RRR no MRG Abd soft ntnd no mass or ascites +bs Ext no LE edema Neuro is alert, Ox 3 , nf    Home meds:  - losartan 50  - omeprazole / tamsulosin 0.4  - rivaroxaban 15 hs  - pravastatin 80  - prn ativan  - prn's/ vitamins/ supplements   UA turbid , orange, >50 rbc, 11-20 wbc , 0-5 epi, few bact, prot 100  US renal > R  12.7 cm = volume: 226 mL .Echo wnl no hydro. L 11.9cm, +mod L hydro, normal echo  CT abd/ pelvis > normal kidney size, mild bilat nephrolithiasis, mild bilat hydro w/ mild dilatation of ureters bilat; debris in the L prox ureter poss hemorrhagic material     Assessment/ Plan: 1. Renal failure - presumably all acute, though most recent creat (0.9) was from 2019.  Bilat hydro suggested obstructive pathology, + ARB effect. Urology placed bilat ureteral stents 2/22 and creat today is improving from peak 7 > down to 4 today. Cont IVF's, will follow.  2. HTN - hold ARB (losartan home med) for 3-4 weeks, if needs BP meds use non acei/ aRB agents please.  3. Atrial fib  4. UTI     Kelly Splinter 01/10/2020, 2:35 PM   Recent Labs  Lab 01/09/20 0443 01/10/20 0414  K 4.5 4.8  BUN 127* 110*  CREATININE 7.06* 4.81*  CALCIUM 7.8* 8.3*  PHOS 7.6* 6.8*   Inpatient medications: . B-complex with vitamin C  1 tablet Oral Daily  . Chlorhexidine Gluconate Cloth  6 each Topical Daily  . LORazepam  0.5-1 mg Oral BID  . Melatonin  3 mg Oral QHS  .  polyethylene glycol  17 g Oral Daily  . tamsulosin  0.4 mg Oral Daily  . traZODone  50 mg Oral QHS   . sodium chloride 125 mL/hr at 01/10/20 0815  . [START ON 01/11/2020] cefTRIAXone (ROCEPHIN)  IV     haloperidol lactate, traMADol

## 2020-01-10 NOTE — TOC Initial Note (Signed)
Transition of Care [facility]) - Initial/Assessment Note    Patient Details  Name: Todd Wilkinson MRN: [id number] Date of Birth: May 20, 1925  [facility] CM/SW Contact:    Dessa Phi, RN Phone Number: 01/10/2020, 10:23 AM  Clinical Narrative:Patient defers to dtr Amy-she prefers home w/HHC vs SNF but willing to discuss w/family if SNF is recommended.                   Expected Discharge Plan: [place] Barriers to Discharge: Continued Medical Work up   Patient Goals and CMS Choice Patient states their goals for this hospitalization and ongoing recovery are:: go home      Expected Discharge Plan and Services Expected Discharge Plan: [place]   Discharge Planning Services: CM Consult   Living arrangements for the past 2 months: Single Family Home                                      Prior Living Arrangements/Services Living arrangements for the past 2 months: Single Family Home Lives with:: Self(daughters come during day & night) Patient language and need for interpreter reviewed:: Yes Do you feel safe going back to the place where you live?: Yes      Need for Family Participation in Patient Care: No (Comment) Care giver support system in place?: Yes (comment) Current home services: DME, Other (comment)(rw,cane,3n1) Criminal Activity/Legal Involvement Pertinent to Current Situation/Hospitalization: No - Comment as needed  Activities of Daily Living Home Assistive Devices/Equipment: Cane (specify quad or straight) ADL Screening (condition at time of admission) Patient's cognitive ability adequate to safely complete daily activities?: Yes Is the patient deaf or have difficulty hearing?: No Does the patient have difficulty seeing, even when wearing glasses/contacts?: No Does the patient have difficulty concentrating, remembering, or making decisions?: Yes Patient able to express need for assistance with ADLs?:  Yes Does the patient have difficulty dressing or bathing?: Yes Independently performs ADLs?: No Communication: Independent Dressing (OT): Dependent Grooming: Dependent Is this a change from baseline?: Change from baseline, expected to last <3 days Feeding: Independent Bathing: Dependent Is this a change from baseline?: Change from baseline, expected to last <3 days Toileting: Dependent Is this a change from baseline?: Change from baseline, expected to last <3 days In/Out Bed: Dependent Is this a change from baseline?: Change from baseline, expected to last <3 days Walks in Home: Independent with device (comment)(cane) Does the patient have difficulty walking or climbing stairs?: Yes Weakness of Legs: Right Weakness of Arms/Hands: None  Permission Sought/Granted Permission sought to share information with : Case Manager Permission granted to share information with : Yes, Verbal Permission Granted  Share Information with NAME: Case Manager     Permission granted to share info w Relationship: Amy Plyler     Emotional Assessment Appearance:: Appears stated age Attitude/Demeanor/Rapport: Gracious Affect (typically observed): Accepting   Alcohol / Substance Use: Not Applicable Psych Involvement: No (comment)  Admission diagnosis:  Urinary retention [R33.9] Acute cystitis with hematuria [N30.01] AKI (acute kidney injury) ([place]) [N17.9] Constipation [K59.00] Patient Active Problem List   Diagnosis Date Noted  . AF (paroxysmal atrial fibrillation) ([place]) 01/08/2020  . Essential hypertension 01/08/2020  . Hyperlipidemia 01/08/2020  . BPH (benign prostatic hyperplasia) 01/08/2020  . Urinary tract infection 01/08/2020  . Abdominal mass, LLQ (left lower quadrant) 01/08/2020  . AKI (acute kidney injury) ([place]) 01/07/2020  PCP:  Deland Pretty, MD Pharmacy:   [id number] - [place], [address] [place] [address] Phone: [phone] Fax: [phone]     Social Determinants of Health (SDOH) Interventions    Readmission Risk Interventions No flowsheet data found.

## 2020-01-10 NOTE — Progress Notes (Signed)
1 Day Post-Op Subjective: No complaints this afternoon. UOP has dramatically improved.  Creatinine improving also.    Objective: Vital signs in last 24 hours: Temp:  [97.2 F (36.2 C)-97.7 F (36.5 C)] 97.2 F (36.2 C) (02/23 1317) Pulse Rate:  [37-81] 49 (02/23 1317) Resp:  [18-20] 18 (02/23 1317) BP: (112-142)/(51-101) 112/58 (02/23 1317) SpO2:  [98 %-100 %] 99 % (02/23 1317)  Intake/Output from previous day: 02/22 0701 - 02/23 0700 In: 2614 [I.V.:2414; IV Piggyback:200] Out: 2175 [Urine:2175]  Intake/Output this shift: Total I/O In: 1061.7 [I.V.:1061.7] Out: 1520 [Urine:1520]  Physical Exam:  General: Alert and oriented CV: RRR, palpable distal pulses Lungs: CTAB, equal chest rise Abdomen: Soft, NTND, no rebound or guarding Gu: Foley in place and draining maroon urine Ext: NT, No erythema  Lab Results: Recent Labs    01/08/20 0215 01/09/20 0443 01/10/20 0414  HGB 11.1* 10.0* 10.0*  HCT 32.2* 29.6* 29.5*   BMET Recent Labs    01/09/20 0443 01/10/20 0414  NA 132* 136  K 4.5 4.8  CL 104 110  CO2 15* 15*  GLUCOSE 106* 131*  BUN 127* 110*  CREATININE 7.06* 4.81*  CALCIUM 7.8* 8.3*     Studies/Results: DG C-Arm 1-60 Min-No Report  Result Date: 01/09/2020 Fluoroscopy was utilized by the requesting physician.  No radiographic interpretation.    Assessment/Plan: 86 year old male with acute urinary retention, bilateral hydronephrosis, gross hematuria and acute renal failure.  S/p cystoscopy with bilateral JJ stent placement on 01/09/20.  -Renal function and UOP improving following stent placement.  Intra-op urine culture is pending.   -Start tamulosin BID and finasteride 5 mg once daily.  -Recommend voiding trial in the AM on 2/26 -Will arrange OP f/u with CT prior to assess for clearance of the clot/debris from his upper urinary tract prior to stent removal.      LOS: 3 days   Ellison Hughs, MD [facility] Pager: [phone]  01/10/2020, 3:55 PM

## 2020-01-10 NOTE — Progress Notes (Signed)
Occupational Therapy Evaluation  Clinical Impression Patient reports living home alone and performing all self-care tasks and functional mobility with Independence. Patient reports daughters live in same town and check on him regularly. Patient is noted with cognitive deficits, so PLOF information may not be accurate. Patient's overall ADL status is set-up to Max Assist with cues for safety and Moderate assist for functional transfers. Patient required the use of rolling walker for bed to recliner transfer with 2 attempts for sit to stand transfer. Patient has decreased awareness of deficits and is deemed a high fall risk. Notified Nursing staff. Patient will benefit from continued skilled acute OT services to decrease level of assist with self-care tasks and functional mobility.     01/10/20 1009  OT Visit Information  Assistance Needed +1  History of Present Illness Todd Wilkinson is a 86 year old Caucasian male with past medical history remarkable for subtle atrial fibrillation, HLD, essential hypertension, chronic constipation, BPH who presented to the ED complaining of bladder discomfort x1 week.  He also states feels that he has trouble emptying his bladder.  Recently seen at urgent care 2 weeks ago, diagnosed with UTI and started on ciprofloxacin; and despite treatment, symptoms of hesitancy and dysuria continued.  He completed 7/14 days of ciprofloxacin and was taking Azo to help with symptoms; with change in his urine color to orange.  Precautions  Precautions Fall  Restrictions  Weight Bearing Restrictions No  Home Living  Family/patient expects to be discharged to: Private residence  Living Arrangements Alone  Available Help at Discharge Family  Type of [place] Access Level entry  [place] One level  Bathroom Shower/Tub Tub/shower unit  [place] bars - tub/shower  Prior Function  Level of Independence Independent  Communication   Communication HOH  Pain Assessment  Pain Assessment No/denies pain  Cognition  Arousal/Alertness Awake/alert  Behavior During Therapy Impulsive  Overall Cognitive Status No family/caregiver present to determine baseline cognitive functioning  General Comments Alert and Oriented to Name only   Upper Extremity Assessment  Upper Extremity Assessment Generalized weakness  Lower Extremity Assessment  Lower Extremity Assessment Defer to PT evaluation  ADL  Overall ADL's  Needs assistance/impaired  Grooming Oral care;Wash/dry face;Wash/dry hands;Set up  Upper Body Bathing Minimal assistance  Lower Body Bathing Maximal assistance  Upper Body Dressing  Minimal assistance  Lower Body Dressing Maximal assistance  Toilet Transfer Moderate assistance  Toileting- Clothing Manipulation and Hygiene Maximal assistance  Tub/ Shower Transfer Moderate assistance  Functional mobility during ADLs Moderate assistance;Rolling walker;Cueing for safety;Cueing for sequencing  Vision- History  Baseline Vision/History Wears glasses  Wears Glasses At all times  Vision- Assessment  Vision Assessment? No apparent visual deficits  Bed Mobility  Overal bed mobility Needs Assistance  Bed Mobility Supine to Sit  Supine to sit Mod assist  General bed mobility comments assist with trunk management  Transfers  Overall transfer level Needs assistance  Equipment used Rolling walker (2 wheeled)  Transfers Sit to/from Stand  Sit to Stand Mod assist  General transfer comment cues for hand placement  Balance  Overall balance assessment Needs assistance  Sitting balance-Leahy Scale Fair  Standing balance-Leahy Scale Poor  OT - End of Session  Equipment Utilized During Treatment Gait belt  Activity Tolerance Patient tolerated treatment well  Patient left in chair;with call bell/phone within reach;with chair alarm set  Nurse Communication Mobility status  OT Assessment  OT Recommendation/Assessment Patient needs  continued OT  Services  OT Visit Diagnosis Unsteadiness on feet (R26.81);Muscle weakness (generalized) (M62.81)  OT Problem List Decreased strength;Decreased activity tolerance;Impaired balance (sitting and/or standing);Decreased safety awareness;Decreased knowledge of use of DME or AE;Decreased knowledge of precautions  OT Plan  OT Frequency (ACUTE ONLY) Min 2X/week  OT Treatment/Interventions (ACUTE ONLY) Self-care/ADL training;Therapeutic exercise;Energy conservation;DME and/or AE instruction;Therapeutic activities;Patient/family education;Balance training  AM-PAC OT "6 Clicks" Daily Activity Outcome Measure (Version 2)  Help from another person eating meals? 3  Help from another person taking care of personal grooming? 3  Help from another person toileting, which includes using toliet, bedpan, or urinal? 2  Help from another person bathing (including washing, rinsing, drying)? 2  Help from another person to put on and taking off regular upper body clothing? 3  Help from another person to put on and taking off regular lower body clothing? 2  6 Click Score 15  OT Recommendation  Follow Up Recommendations SNF  OT Equipment Tub/shower bench  Individuals Consulted  Consulted and Agree with Results and Recommendations Patient  Acute Rehab OT Goals  Patient Stated Goal to go home  OT Goal Formulation With patient  Time For Goal Achievement 01/24/20  Potential to Achieve Goals Fair  OT Time Calculation  OT Start Time (ACUTE ONLY) 1000  OT Stop Time (ACUTE ONLY) 1038  OT Time Calculation (min) 38 min  OT General Charges  $OT Visit 1 Visit  OT Evaluation  $OT Eval Moderate Complexity 1 Mod  OT Treatments  $Self Care/Home Management  23-37 mins  Written Expression  Dominant Hand Right   Yuktha Kerchner OTR/L

## 2020-01-10 NOTE — TOC Progression Note (Signed)
Transition of Care [facility]) - Progression Note    Patient Details  Name: Todd Wilkinson MRN: [id number] Date of Birth: May 24, 1925  Transition of Care [facility]) CM/SW Contact  Genesi Stefanko, Juliann Pulse, RN Phone Number: 01/10/2020, 2:16 PM  Clinical Narrative: Informed dtr Amy of PT recc SNF-she will discuss w/family & let me know tomorrow if willing to allow faxing out to SNF.      Expected Discharge Plan: [place] Barriers to Discharge: Continued Medical Work up  Expected Discharge Plan and Services Expected Discharge Plan: [place]   Discharge Planning Services: CM Consult   Living arrangements for the past 2 months: Single Family Home                                       Social Determinants of Health (SDOH) Interventions    Readmission Risk Interventions No flowsheet data found.

## 2020-01-10 NOTE — Evaluation (Signed)
Physical Therapy Evaluation Patient Details Name: Todd Wilkinson MRN: [id number] DOB: October 14, 1925 Today's Date: 01/10/2020   History of Present Illness  86 year old Caucasian male with past medical history remarkable for subtle atrial fibrillation, HLD, essential hypertension, chronic constipation, BPH and admitted for AKI. Pt s/p bilat ureteral stents 01/09/20  Clinical Impression  Pt admitted with above diagnosis.  Pt currently with functional limitations due to the deficits listed below (see PT Problem List). Pt will benefit from skilled PT to increase their independence and safety with mobility to allow discharge to the venue listed below.  Pt having great difficulty with sit to stands and requiring min-mod assist for ambulating at this time due to unsteadiness.  Recommend SNF upon d/c.      Follow Up Recommendations SNF    Equipment Recommendations  None recommended by PT    Recommendations for Other Services       Precautions / Restrictions Precautions Precautions: Fall      Mobility  Bed Mobility               General bed mobility comments: pt in recliner on arrival  Transfers Overall transfer level: Needs assistance Equipment used: Rolling walker (2 wheeled) Transfers: Sit to/from Stand Sit to Stand: Mod assist         General transfer comment: verbal cues for hand placement, assist to rise AND steady, pt with posterior lean  Ambulation/Gait Ambulation/Gait assistance: Min assist;Mod assist Gait Distance (Feet): 100 Feet Assistive device: Rolling walker (2 wheeled) Gait Pattern/deviations: Step-through pattern;Decreased stride length;Wide base of support     General Gait Details: verbal cues for safe use of RW, posture, max cues for remaining inside RW, mod assist for turning otherwise min assist for stabilizing  Stairs            Wheelchair Mobility    Modified Rankin (Stroke Patients Only)       Balance Overall balance assessment: Needs  assistance         Standing balance support: Bilateral upper extremity supported Standing balance-Leahy Scale: Poor Standing balance comment: reliant on UE support, unable to tolerate any challenges                             Pertinent Vitals/Pain Pain Assessment: No/denies pain    Home Living Family/patient expects to be discharged to:: Private residence Living Arrangements: Alone Available Help at Discharge: Family;Available PRN/intermittently Type of Home: House Home Access: Stairs to enter   [employer] of Steps: "a few" Home Layout: One level Home Equipment: Walker - 2 wheels      Prior Function Level of Independence: Independent with assistive device(s)         Comments: pt reports using a RW "sometimes"     Hand Dominance   Dominant Hand: Right    Extremity/Trunk Assessment   Upper Extremity Assessment Upper Extremity Assessment: Generalized weakness    Lower Extremity Assessment Lower Extremity Assessment: Generalized weakness       Communication   Communication: HOH  Cognition Arousal/Alertness: Awake/alert Behavior During Therapy: Impulsive Overall Cognitive Status: No family/caregiver present to determine baseline cognitive functioning                                 General Comments: impulsive however will follow simple commands      General Comments      Exercises  Assessment/Plan    PT Assessment Patient needs continued PT services  PT Problem List Decreased strength;Decreased activity tolerance;Decreased balance;Decreased knowledge of use of DME;Decreased mobility;Decreased cognition       PT Treatment Interventions DME instruction;Therapeutic exercise;Gait training;Balance training;Functional mobility training;Therapeutic activities;Patient/family education    PT Goals (Current goals can be found in the Care Plan section)  Acute Rehab PT Goals Patient Stated Goal: to go home PT Goal  Formulation: With patient Time For Goal Achievement: 01/24/20 Potential to Achieve Goals: Fair    Frequency Min 2X/week   Barriers to discharge        Co-evaluation               AM-PAC PT "6 Clicks" Mobility  Outcome Measure Help needed turning from your back to your side while in a flat bed without using bedrails?: A Little Help needed moving from lying on your back to sitting on the side of a flat bed without using bedrails?: A Little Help needed moving to and from a bed to a chair (including a wheelchair)?: A Lot Help needed standing up from a chair using your arms (e.g., wheelchair or bedside chair)?: A Lot Help needed to walk in hospital room?: A Lot Help needed climbing 3-5 steps with a railing? : Total 6 Click Score: 13    End of Session Equipment Utilized During Treatment: Gait belt Activity Tolerance: Patient tolerated treatment well Patient left: in chair;with call bell/phone within reach;with nursing/sitter in room(nurse tech to bathe pt and assist back to bed)   PT Visit Diagnosis: Other abnormalities of gait and mobility (R26.89)    Time: 1145-1200 PT Time Calculation (min) (ACUTE ONLY): 15 min   Charges:   PT Evaluation $PT Eval Low Complexity: 1 Low     Kati PT, DPT Acute Rehabilitation Services Office: [phone]  Trena Platt 01/10/2020, 12:54 PM

## 2020-01-10 NOTE — Progress Notes (Signed)
PROGRESS NOTE    Todd Wilkinson  [id number] DOB: Oct 05, 1925 DOA: 01/07/2020 PCP: Deland Pretty, MD    Brief Narrative:   Todd Wilkinson is a 86 year old Caucasian male with past medical history remarkable for subtle atrial fibrillation, HLD, essential hypertension, chronic constipation, BPH who presented to the ED complaining of bladder discomfort x1 week.  He also states feels that he has trouble emptying his bladder.  Recently seen at urgent care 2 weeks ago, diagnosed with UTI and started on ciprofloxacin; and despite treatment, symptoms of hesitancy and dysuria continued.  He completed 7/14 days of ciprofloxacin and was taking Azo to help with symptoms; with change in his urine color to orange.  In the ED, temperature 97.5, HR 80, RR 17, BP 134/64, SPO2 90% on room air.  Sodium 133, CO2 17, BUN 112, creatinine 5.8, WBC count 16.9.  Renal ultrasound notable for moderate left hydronephrosis, 4.3 cm mass left lower quadrant described as possible enlarged lymph node.  Patient was given morphine, ceftriaxone, oxybutynin and referred for admission given acute renal failure, left hydronephrosis, left lower quadrant mass, and UTI for further treatment and evaluation.   Assessment & Plan:   Principal Problem:   AKI (acute kidney injury) ([place]) Active Problems:   AF (paroxysmal atrial fibrillation) (HCC)   Essential hypertension   Hyperlipidemia   BPH (benign prostatic hyperplasia)   Urinary tract infection   Abdominal mass, LLQ (left lower quadrant)   Acute renal failure Obstructive uropathy with Bilateral hydronephrosis Patient presenting with bladder pain and decreased urine output.  Creatinine noted to be elevated to 5.80 and BUN 112 with a previous baseline on review of EMR of 0.98 in October 2019.  Patient with history of BPH on tamsulosin.  Bladder scan in the ED notable for >948mL retained urine in Foley catheter was placed.  Renal ultrasound notable for moderate left  hydronephrosis with debris in renal pelvis.  CT abdomen/pelvis with mild symmetric hydronephrosis, no stones, hyperdense material left UPJ, proximal ureter and distal right ureter suggestive of of hemorrhagic material and bilateral perinephric stranding. --Urology nephrology following, appreciate assistance --s/p cystoscopy with bilateral ureteral stent placement on 01/09/2020 --Cr 5.80-->5.18-->7.06-->4.81 --BUN 112-->118-->127-->110 --UOP past 24hr 2,176mL --continue IVF hydration with NS at 125 mL per hour --Continue to hold home ARB --Avoid nephrotoxins, renally dose all medications --Strict I's and O's --Consider voiding trial in 2-3 days per urology  Urinary tract infection Pyelonephritis Was on ciprofloxacin completed 7/14 days of outpatient treatment.  Urinalysis with small leukoesterase, positive nitrite, few bacteria, greater than 50 RBCs, and 11-20 WBCs.  Urine culture with no growth.  CT abdomen/pelvis notable for bilateral perinephric stranding consistent with pyelonephritis. --Urine culture 01/09/2020 during cystoscopy: Pending --Continue ceftriaxone 2 g IV every 24 hours  Left lower quadrant abdominal mass Renal ultrasound with 4.3 cm mass left lower quadrant, questionable enlarged lymph node. CT abdomen/pelvis without contrast notable for a 2.7 cm left periaortic lymph node  Paroxysmal atrial fibrillation On Xarelto 50 mg p.o. daily outpatient, not on any rate controlling agents. --holding home xarelto with renal function decline --Heparin drip discontinued on 01/08/2020 given likely blood clot within the ureter --continue to monitor on telemetry  Hyperlipidemia --holding home statin  Essential hypertension BP 144/101 this am; stable --Holding home losartan secondary to AKI --Hydralazine prn  BPH Review of EMR notable for PSA of 20 on 2019. --repeat PSA elevated to 72 --Continue tamsulosin --Urology following  Sundowning --Delirium precautions, lights on during  the day, off at night --  Melatonin 3 mg p.o. daily --Trazodone 50 mg p.o. nightly   DVT prophylaxis: SCD's Code Status: DNR Family Communication: updated patients daughter Amy via telephone this morning Disposition Plan: From home, lives alone.  Remain inpatient, continue IV fluids, close monitoring of renal function with strict I's and O's and Foley catheter.  Pending PT/OT evaluation, may need higher level care when ready for discharge.   Consultants:   Urology - Dr. Lovena Neighbours  Nephrology - Dr. Jonnie Finner  Procedures:   Cystoscopy with bilateral ureteral stent placement 01/09/2020; Dr. Lovena Neighbours  Antimicrobials:   Ceftriaxone 2/20>>   Subjective: Patient seen and examined at bedside, resting comfortably.  Still believes that he is at home in [place], but knows is 2021 the president is Edmon Crape.  Urine output has picked up since cystoscopy with stent placement yesterday, urine grossly bloody in Foley bag.  Creatinine improving. No other specific complaints this morning. Denies headache, no chest pain, no palpitations, no shortness of breath, no abdominal pain, no weakness, no fatigue.  No other acute events overnight per nursing staff.  Objective: Vitals:   01/09/20 1830 01/09/20 1953 01/09/20 2343 01/10/20 0519  BP: 120/74 135/74 (!) 119/51 (!) 142/101  Pulse: 66 81 62 76  Resp: 18 20 18 20   Temp: 97.7 F (36.5 C) (!) 97.4 F (36.3 C) 97.6 F (36.4 C) (!) 97.5 F (36.4 C)  TempSrc: Oral  Oral Oral  SpO2: 99% 99% 98% 98%  Weight:      Height:        Intake/Output Summary (Last 24 hours) at 01/10/2020 0950 Last data filed at 01/10/2020 0817 Gross per 24 hour  Intake 2614.02 ml  Output 2995 ml  Net -380.98 ml   Filed Weights   01/08/20 2331  Weight: 74.5 kg    Examination:  General exam: Appears calm and comfortable, pleasantly confused  Respiratory system: Clear to auscultation. Respiratory effort normal. Oxygenating well on room air Cardiovascular system: S1 & S2  heard, RRR. No JVD, murmurs, rubs, gallops or clicks. No pedal edema. Gastrointestinal system: Abdomen is nondistended, soft and nontender. No organomegaly or masses felt. Normal bowel sounds heard. GU: Foley noted with dark red urine in bag Central nervous system: Alert, oriented to name, time and person (2021/[place]), but not Place ([place]). No focal neurological deficits. Extremities: Symmetric 5 x 5 power. Skin: No rashes, lesions or ulcers Psychiatry: Judgement and insight appear poor. Mood & affect appropriate.     Data Reviewed: I have personally reviewed following labs and imaging studies  CBC: Recent Labs  Lab 01/07/20 1219 01/08/20 0215 01/09/20 0443 01/10/20 0414  WBC 16.9* 15.6* 11.9* 6.9  NEUTROABS 12.7*  --   --   --   HGB 11.6* 11.1* 10.0* 10.0*  HCT 34.8* 32.2* 29.6* 29.5*  MCV 101.5* 99.4 102.1* 102.4*  PLT 136* 126* 114* [id number]*   Basic Metabolic Panel: Recent Labs  Lab 01/07/20 1219 01/08/20 0215 01/09/20 0443 01/10/20 0414  NA 133* 133* 132* 136  K 4.2 4.1 4.5 4.8  CL 101 102 104 110  CO2 17* 16* 15* 15*  GLUCOSE 109* 106* 106* 131*  BUN 112* 118* 127* 110*  CREATININE 5.80* 5.18* 7.06* 4.81*  CALCIUM 8.4* 8.1* 7.8* 8.3*  PHOS  --  6.5* 7.6* 6.8*   GFR: Estimated Creatinine Clearance: 9.8 mL/min (A) (by C-G formula based on SCr of 4.81 mg/dL (H)). Liver Function Tests: Recent Labs  Lab 01/07/20 1219 01/08/20 0215 01/09/20 0443 01/10/20 0414  AST 22 18  --   --  ALT 25 22  --   --   ALKPHOS 76 72  --   --   BILITOT 4.1* 3.3*  --   --   PROT 6.4* 5.9*  --   --   ALBUMIN 3.1* 2.7*   2.8* 2.4* 2.4*   No results for input(s): LIPASE, AMYLASE in the last 168 hours. No results for input(s): AMMONIA in the last 168 hours. Coagulation Profile: Recent Labs  Lab 01/08/20 0215  INR 2.5*   Cardiac Enzymes: No results for input(s): CKTOTAL, CKMB, CKMBINDEX, TROPONINI in the last 168 hours. BNP (last 3 results) No results for input(s):  PROBNP in the last 8760 hours. HbA1C: No results for input(s): HGBA1C in the last 72 hours. CBG: No results for input(s): GLUCAP in the last 168 hours. Lipid Profile: No results for input(s): CHOL, HDL, LDLCALC, TRIG, CHOLHDL, LDLDIRECT in the last 72 hours. Thyroid Function Tests: No results for input(s): TSH, T4TOTAL, FREET4, T3FREE, THYROIDAB in the last 72 hours. Anemia Panel: No results for input(s): VITAMINB12, FOLATE, FERRITIN, TIBC, IRON, RETICCTPCT in the last 72 hours. Sepsis Labs: No results for input(s): PROCALCITON, LATICACIDVEN in the last 168 hours.  Recent Results (from the past 240 hour(s))  Urine culture     Status: None   Collection Time: 01/07/20 11:25 AM   Specimen: Urine, Random  Result Value Ref Range Status   Specimen Description   Final    URINE, RANDOM Performed at [facility] [address] [place], [place] [address]    Special Requests   Final    NONE Performed at [facility], [place] [address] [place], [place] [address]    Culture   Final    NO GROWTH Performed at [facility], [place] [address] [place], [place] [address]    Report Status 01/08/2020 FINAL  Final  SARS CORONAVIRUS 2 (TAT 6-24 HRS) Nasopharyngeal Nasopharyngeal Swab     Status: None   Collection Time: 01/07/20  2:56 PM   Specimen: Nasopharyngeal Swab  Result Value Ref Range Status   SARS Coronavirus 2 NEGATIVE NEGATIVE Final    Comment: (NOTE) SARS-CoV-2 target nucleic acids are NOT DETECTED. The SARS-CoV-2 RNA is generally detectable in upper and lower respiratory specimens during the acute phase of infection. Negative results do not preclude SARS-CoV-2 infection, do not rule out co-infections with other pathogens, and should not be used as the sole basis for treatment or other patient management decisions. Negative results must be combined with clinical observations, patient history, and epidemiological information. The  expected result is Negative. Fact Sheet for Patients: SugarRoll.be Fact Sheet for Healthcare Providers: https://www.woods-mathews.com/ This test is not yet approved or cleared by the [place] FDA and  has been authorized for detection and/or diagnosis of SARS-CoV-2 by FDA under an Emergency Use Authorization (EUA). This EUA will remain  in effect (meaning this test can be used) for the duration of the COVID-19 declaration under Section 56 4(b)(1) of the Act, 21 U.S.C. section 360bbb-3(b)(1), unless the authorization is terminated or revoked sooner. Performed at [facility], [place] [address] [place], [place] [address]          Radiology Studies: DG C-Arm 1-60 Min-No Report  Result Date: 01/09/2020 Fluoroscopy was utilized by the requesting physician.  No radiographic interpretation.        Scheduled Meds:  B-complex with vitamin C  1 tablet Oral Daily   Chlorhexidine Gluconate Cloth  6 each Topical Daily   LORazepam  0.5-1 mg Oral BID   Melatonin  3 mg Oral QHS   polyethylene glycol  17 g Oral Daily   tamsulosin  0.4 mg Oral Daily   traZODone  50 mg Oral QHS   Continuous Infusions:  sodium chloride 125 mL/hr at 01/10/20 0815     LOS: 3 days    Time spent: 36 minutes spent on chart review, discussion with nursing staff, consultants, updating family and interview/physical exam; more than 50% of that time was spent in counseling and/or coordination of care.    Kasean Denherder J [place] DO [facility] Available via Epic secure chat 7am-7pm After these hours, please refer to coverage provider listed on [email or web address] 01/10/2020, 9:50 AM

## 2020-01-11 LAB — RENAL FUNCTION PANEL
Albumin: 2.5 g/dL — ABNORMAL LOW (ref 3.5–5.0)
Anion gap: 7 (ref 5–15)
BUN: 79 mg/dL — ABNORMAL HIGH (ref 8–23)
CO2: 19 mmol/L — ABNORMAL LOW (ref 22–32)
Calcium: 8.4 mg/dL — ABNORMAL LOW (ref 8.9–10.3)
Chloride: 115 mmol/L — ABNORMAL HIGH (ref 98–111)
Creatinine, Ser: 2.68 mg/dL — ABNORMAL HIGH (ref 0.61–1.24)
GFR calc Af Amer: 23 mL/min — ABNORMAL LOW (ref >60)
GFR calc non Af Amer: 20 mL/min — ABNORMAL LOW (ref >60)
Glucose, Bld: 108 mg/dL — ABNORMAL HIGH (ref 70–99)
Phosphorus: 3.8 mg/dL (ref 2.5–4.6)
Potassium: 4.2 mmol/L (ref 3.5–5.1)
Sodium: 141 mmol/L (ref 135–145)

## 2020-01-11 LAB — CBC
HCT: 32.1 % — ABNORMAL LOW (ref 39.0–52.0)
Hemoglobin: 10.2 g/dL — ABNORMAL LOW (ref 13.0–17.0)
MCH: 33.4 pg (ref 26.0–34.0)
MCHC: 31.8 g/dL (ref 30.0–36.0)
MCV: 105.2 fL — ABNORMAL HIGH (ref 80.0–100.0)
Platelets: 154 10*3/uL (ref 150–400)
RBC: 3.05 MIL/uL — ABNORMAL LOW (ref 4.22–5.81)
RDW: 14.8 % (ref 11.5–15.5)
WBC: 9.2 10*3/uL (ref 4.0–10.5)
nRBC: 0 % (ref 0.0–0.2)

## 2020-01-11 NOTE — Progress Notes (Signed)
[facility] Progress Note  Subjective: creat down 2.6, large UOP  Vitals:   01/10/20 0519 01/10/20 1317 01/10/20 2139 01/11/20 0435  BP: (!) 142/101 (!) 112/58 130/69 101/88  Pulse: 76 (!) 49 (!) 58 68  Resp: 20 18 18 16   Temp: (!) 97.5 F (36.4 C) (!) 97.2 F (36.2 C) (!) 97.4 F (36.3 C) (!) 97.1 F (36.2 C)  TempSrc: Oral Oral Oral Oral  SpO2: 98% 99%  97%  Weight:      Height:        Exam: Gen alert Sclera anicteric, throat sig dry No jvd or bruits Chest clear bilat to bases RRR no MRG Abd soft ntnd no mass or ascites +bs Ext no LE edema Neuro is alert, Ox 3 , nf    Home meds:  - losartan 50  - omeprazole / tamsulosin 0.4  - rivaroxaban 15 hs  - pravastatin 80  - prn ativan  - prn's/ vitamins/ supplements   UA turbid , orange, >50 rbc, 11-20 wbc , 0-5 epi, few bact, prot 100  US renal > R  12.7 cm = volume: 226 mL .Echo wnl no hydro. L 11.9cm, +mod L hydro, normal echo  CT abd/ pelvis > normal kidney size, mild bilat nephrolithiasis, mild bilat hydro w/ mild dilatation of ureters bilat; debris in the L prox ureter poss hemorrhagic material     Assessment/ Plan: 1. Renal failure - presumably all acute, though most recent creat (0.9) was from 2019.  Bilat hydro suggested obstructive pathology, also possible ARB effect. Urology placed bilat ureteral stents 2/22 and AKI resolving, creat down to 2.6 today. No other suggestions, will sign off.   2. HTN - hold ARB (losartan home med) for 3-4 weeks, if needs BP meds use non acei/ aRB agents please.  3. Atrial fib  4. UTI     Rob Osher Oettinger 01/11/2020, 11:01 AM   Recent Labs  Lab 01/10/20 0414 01/11/20 0416  K 4.8 4.2  BUN 110* 79*  CREATININE 4.81* 2.68*  CALCIUM 8.3* 8.4*  PHOS 6.8* 3.8   Inpatient medications: . B-complex with vitamin C  1 tablet Oral Daily  . Chlorhexidine Gluconate Cloth  6 each Topical Daily  . finasteride  5 mg Oral Daily  . LORazepam  0.5-1 mg Oral BID  .  Melatonin  3 mg Oral QHS  . polyethylene glycol  17 g Oral Daily  . tamsulosin  0.4 mg Oral BID  . traZODone  50 mg Oral QHS   . sodium chloride 125 mL/hr at 01/11/20 0315  . cefTRIAXone (ROCEPHIN)  IV 2 g (01/11/20 0827)   haloperidol lactate, traMADol

## 2020-01-11 NOTE — Progress Notes (Signed)
Tele notified of pt having a 2.51 second pause at [place]. This RN checked on pt and pt was resting in bed watching tv.

## 2020-01-11 NOTE — Progress Notes (Signed)
PROGRESS NOTE    Todd Wilkinson  [id number] DOB: July 27, 1925 DOA: 01/07/2020 PCP: Deland Pretty, MD    Brief Narrative:   Todd Wilkinson is a 86 year old Caucasian male with past medical history remarkable for subtle atrial fibrillation, HLD, essential hypertension, chronic constipation, BPH who presented to the ED complaining of bladder discomfort x1 week.  He also states feels that he has trouble emptying his bladder.  Recently seen at urgent care 2 weeks ago, diagnosed with UTI and started on ciprofloxacin; and despite treatment, symptoms of hesitancy and dysuria continued.  He completed 7/14 days of ciprofloxacin and was taking Azo to help with symptoms; with change in his urine color to orange.  In the ED, temperature 97.5, HR 80, RR 17, BP 134/64, SPO2 90% on room air.  Sodium 133, CO2 17, BUN 112, creatinine 5.8, WBC count 16.9.  Renal ultrasound notable for moderate left hydronephrosis, 4.3 cm mass left lower quadrant described as possible enlarged lymph node.  Patient was given morphine, ceftriaxone, oxybutynin and referred for admission given acute renal failure, left hydronephrosis, left lower quadrant mass, and UTI for further treatment and evaluation.   Assessment & Plan:   Principal Problem:   AKI (acute kidney injury) ([place]) Active Problems:   AF (paroxysmal atrial fibrillation) ([facility])   Essential hypertension   Hyperlipidemia   BPH (benign prostatic hyperplasia)   Urinary tract infection   Abdominal mass, LLQ (left lower quadrant)   Acute renal failure Obstructive uropathy with Bilateral hydronephrosis Patient presenting with bladder pain and decreased urine output.  Creatinine noted to be elevated to 5.80 and BUN 112 with a previous baseline on review of EMR of 0.98 in October 2019.  Patient with history of BPH on tamsulosin.  Bladder scan in the ED notable for >929mL retained urine in Foley catheter was placed.  Renal ultrasound notable for moderate left  hydronephrosis with debris in renal pelvis.  CT abdomen/pelvis with mild symmetric hydronephrosis, no stones, hyperdense material left UPJ, proximal ureter and distal right ureter suggestive of of hemorrhagic material and bilateral perinephric stranding. --Urology nephrology following, appreciate assistance --s/p cystoscopy with bilateral ureteral stent placement on 01/09/2020 --Cr 5.80-->5.18-->7.06-->4.81-->2.68 --BUN 112-->118-->127-->110-->79 --UOP past 24hr 3,575mL --continue IVF hydration with NS at 125 mL per hour --Continue to hold home ARB; nephrology recommends holding for 3-4 wks post discharge --Avoid nephrotoxins, renally dose all medications --Strict I's and O's --Plan voiding trial 2/26 am per urology  Urinary tract infection Pyelonephritis Was on ciprofloxacin completed 7/14 days of outpatient treatment.  Urinalysis with small leukoesterase, positive nitrite, few bacteria, greater than 50 RBCs, and 11-20 WBCs.  Urine culture with no growth.  CT abdomen/pelvis notable for bilateral perinephric stranding consistent with pyelonephritis. --Urine culture 01/09/2020 during cystoscopy: No growth, although was on antibiotics for 7 days prior to admission --Continue ceftriaxone 2 g IV every 24 hours  Left lower quadrant abdominal mass 2/2 periaortic lymph node Renal ultrasound with 4.3 cm mass left lower quadrant, questionable enlarged lymph node. CT abdomen/pelvis without contrast notable for a 2.7 cm left periaortic lymph node  Paroxysmal atrial fibrillation On Xarelto 50 mg p.o. daily outpatient, not on any rate controlling agents. --holding home xarelto with renal function decline --Heparin drip discontinued on 01/08/2020 given likely blood clot within the ureter --continue to monitor on telemetry  Hyperlipidemia --holding home statin  Essential hypertension BP 101/81 this am; stable --Holding home losartan secondary to AKI; will hold 3-4 weeks following discharge per  nephrology recommendation --Hydralazine prn  BPH  Review of EMR notable for PSA of 20 on 2019. --repeat PSA elevated to 72 --Continue tamsulosin 0.4 mg p.o. twice daily and finasteride --Urology following  Sundowning --Delirium precautions, lights on during the day, off at night --Melatonin 3 mg p.o. qHS --Trazodone 50 mg p.o. nightly   DVT prophylaxis: SCD's Code Status: DNR Family Communication: updated patients daughter Amy via telephone this morning Disposition Plan: From home, lives alone.  Remain inpatient, continue IV fluids, close monitoring of renal function with strict I's and O's and Foley catheter.  Therapy recommending SNF, family to discuss; plan voiding trial on Friday morning   Consultants:   Urology - Dr. Lovena Neighbours  Nephrology - Dr. Jonnie Finner  Procedures:   Cystoscopy with bilateral ureteral stent placement 01/09/2020; Dr. Lovena Neighbours  Antimicrobials:   Ceftriaxone 2/20>>   Subjective: Patient seen and examined at bedside, resting comfortably.  Renal function improving.  More alert and oriented this morning, knows he is at [facility] long hospital in [place], year 2021 and findings of present.  Still with intermittent episodes of confusion per nursing staff.  No specific complaints this morning. Denies headache, no chest pain, no palpitations, no shortness of breath, no abdominal pain, no weakness, no fatigue.  No other acute events overnight per nursing staff.  Objective: Vitals:   01/10/20 0519 01/10/20 1317 01/10/20 2139 01/11/20 0435  BP: (!) 142/101 (!) 112/58 130/69 101/88  Pulse: 76 (!) 49 (!) 58 68  Resp: 20 18 18 16   Temp: (!) 97.5 F (36.4 C) (!) 97.2 F (36.2 C) (!) 97.4 F (36.3 C) (!) 97.1 F (36.2 C)  TempSrc: Oral Oral Oral Oral  SpO2: 98% 99%  97%  Weight:      Height:        Intake/Output Summary (Last 24 hours) at 01/11/2020 1151 Last data filed at 01/11/2020 0600 Gross per 24 hour  Intake 3001.53 ml  Output 2775 ml  Net 226.53 ml    Filed Weights   01/08/20 2331  Weight: 74.5 kg    Examination:  General exam: Appears calm and comfortable Respiratory system: Clear to auscultation. Respiratory effort normal. Oxygenating well on room air Cardiovascular system: S1 & S2 heard, RRR. No JVD, murmurs, rubs, gallops or clicks. No pedal edema. Gastrointestinal system: Abdomen is nondistended, soft and nontender. No organomegaly or masses felt. Normal bowel sounds heard. GU: Foley noted with dark red urine in bag Central nervous system: Alert, oriented to name, time and person (2021/[place]), place  [facility]). No focal neurological deficits. Extremities: Symmetric 5 x 5 power. Skin: No rashes, lesions or ulcers Psychiatry: Judgement and insight appear poor. Mood & affect appropriate.     Data Reviewed: I have personally reviewed following labs and imaging studies  CBC: Recent Labs  Lab 01/07/20 1219 01/08/20 0215 01/09/20 0443 01/10/20 0414 01/11/20 0416  WBC 16.9* 15.6* 11.9* 6.9 9.2  NEUTROABS 12.7*  --   --   --   --   HGB 11.6* 11.1* 10.0* 10.0* 10.2*  HCT 34.8* 32.2* 29.6* 29.5* 32.1*  MCV 101.5* 99.4 102.1* 102.4* 105.2*  PLT 136* 126* 114* 115* [id number]   Basic Metabolic Panel: Recent Labs  Lab 01/07/20 1219 01/08/20 0215 01/09/20 0443 01/10/20 0414 01/11/20 0416  NA 133* 133* 132* 136 141  K 4.2 4.1 4.5 4.8 4.2  CL 101 102 104 110 115*  CO2 17* 16* 15* 15* 19*  GLUCOSE 109* 106* 106* 131* 108*  BUN 112* 118* 127* 110* 79*  CREATININE 5.80* 5.18* 7.06*  4.81* 2.68*  CALCIUM 8.4* 8.1* 7.8* 8.3* 8.4*  PHOS  --  6.5* 7.6* 6.8* 3.8   GFR: Estimated Creatinine Clearance: 17.6 mL/min (A) (by C-G formula based on SCr of 2.68 mg/dL (H)). Liver Function Tests: Recent Labs  Lab 01/07/20 1219 01/08/20 0215 01/09/20 0443 01/10/20 0414 01/11/20 0416  AST 22 18  --   --   --   ALT 25 22  --   --   --   ALKPHOS 76 72  --   --   --   BILITOT 4.1* 3.3*  --   --   --   PROT 6.4* 5.9*  --    --   --   ALBUMIN 3.1* 2.7*  2.8* 2.4* 2.4* 2.5*   No results for input(s): LIPASE, AMYLASE in the last 168 hours. No results for input(s): AMMONIA in the last 168 hours. Coagulation Profile: Recent Labs  Lab 01/08/20 0215  INR 2.5*   Cardiac Enzymes: No results for input(s): CKTOTAL, CKMB, CKMBINDEX, TROPONINI in the last 168 hours. BNP (last 3 results) No results for input(s): PROBNP in the last 8760 hours. HbA1C: No results for input(s): HGBA1C in the last 72 hours. CBG: No results for input(s): GLUCAP in the last 168 hours. Lipid Profile: No results for input(s): CHOL, HDL, LDLCALC, TRIG, CHOLHDL, LDLDIRECT in the last 72 hours. Thyroid Function Tests: No results for input(s): TSH, T4TOTAL, FREET4, T3FREE, THYROIDAB in the last 72 hours. Anemia Panel: No results for input(s): VITAMINB12, FOLATE, FERRITIN, TIBC, IRON, RETICCTPCT in the last 72 hours. Sepsis Labs: No results for input(s): PROCALCITON, LATICACIDVEN in the last 168 hours.  Recent Results (from the past 240 hour(s))  Urine culture     Status: None   Collection Time: 01/07/20 11:25 AM   Specimen: Urine, Random  Result Value Ref Range Status   Specimen Description   Final    URINE, RANDOM Performed at [facility] [address], [place], [place] [address]    Special Requests   Final    NONE Performed at [facility], [place] [address], [place], [place] [address]    Culture   Final    NO GROWTH Performed at [facility], [place] [address], [place], [place] [address]    Report Status 01/08/2020 FINAL  Final  SARS CORONAVIRUS 2 (TAT 6-24 HRS) Nasopharyngeal Nasopharyngeal Swab     Status: None   Collection Time: 01/07/20  2:56 PM   Specimen: Nasopharyngeal Swab  Result Value Ref Range Status   SARS Coronavirus 2 NEGATIVE NEGATIVE Final    Comment: (NOTE) SARS-CoV-2 target nucleic acids are NOT DETECTED. The SARS-CoV-2 RNA is generally detectable in upper and  lower respiratory specimens during the acute phase of infection. Negative results do not preclude SARS-CoV-2 infection, do not rule out co-infections with other pathogens, and should not be used as the sole basis for treatment or other patient management decisions. Negative results must be combined with clinical observations, patient history, and epidemiological information. The expected result is Negative. Fact Sheet for Patients: SugarRoll.be Fact Sheet for Healthcare Providers: https://www.woods-mathews.com/ This test is not yet approved or cleared by the [place] FDA and  has been authorized for detection and/or diagnosis of SARS-CoV-2 by FDA under an Emergency Use Authorization (EUA). This EUA will remain  in effect (meaning this test can be used) for the duration of the COVID-19 declaration under Section 56 4(b)(1) of the Act, 21 U.S.C. section 360bbb-3(b)(1), unless the authorization is  terminated or revoked sooner. Performed at [facility], [place] [address] [place], [place] [address]   Urine Culture     Status: None   Collection Time: 01/09/20  1:13 PM   Specimen: Urine, Random  Result Value Ref Range Status   Specimen Description   Final    URINE, RANDOM CYTOSCOPE Performed at [facility], [place] [address] [place], [place] [address]    Special Requests   Final    NONE Performed at [facility], [place] [address], [place], [place] [address]    Culture   Final    NO GROWTH Performed at [facility], [place] [address], [place], [place] [address]    Report Status 01/10/2020 FINAL  Final         Radiology Studies: DG C-Arm 1-60 Min-No Report  Result Date: 01/09/2020 Fluoroscopy was utilized by the requesting physician.  No radiographic interpretation.        Scheduled Meds: . B-complex with vitamin C  1 tablet Oral Daily  . Chlorhexidine Gluconate Cloth  6 each Topical  Daily  . finasteride  5 mg Oral Daily  . LORazepam  0.5-1 mg Oral BID  . Melatonin  3 mg Oral QHS  . polyethylene glycol  17 g Oral Daily  . tamsulosin  0.4 mg Oral BID  . traZODone  50 mg Oral QHS   Continuous Infusions: . sodium chloride 125 mL/hr at 01/11/20 0315  . cefTRIAXone (ROCEPHIN)  IV 2 g (01/11/20 0827)     LOS: 4 days    Time spent: 35 minutes spent on chart review, discussion with nursing staff, consultants, updating family and interview/physical exam; more than 50% of that time was spent in counseling and/or coordination of care.    Astaria Nanez J [place] DO [facility] Available via Epic secure chat 7am-7pm After these hours, please refer to coverage provider listed on [email or web address] 01/11/2020, 11:51 AM

## 2020-01-11 NOTE — Anesthesia Postprocedure Evaluation (Signed)
Anesthesia Post Note  Patient: Todd Wilkinson  Procedure(s) Performed: CYSTOSCOPY WITH RETROGRADE PYELOGRAM AND STENT PLACEMENT (Bilateral )     Patient location during evaluation: PACU Anesthesia Type: General Level of consciousness: awake and alert Pain management: pain level controlled Vital Signs Assessment: post-procedure vital signs reviewed and stable Respiratory status: spontaneous breathing, nonlabored ventilation, respiratory function stable and patient connected to nasal cannula oxygen Cardiovascular status: blood pressure returned to baseline and stable Postop Assessment: no apparent nausea or vomiting Anesthetic complications: no    Last Vitals:  Vitals:   01/11/20 0435 01/11/20 1251  BP: 101/88 (!) 147/62  Pulse: 68 70  Resp: 16 16  Temp: (!) 36.2 C 36.4 C  SpO2: 97% 99%    Last Pain:  Vitals:   01/11/20 1251  TempSrc: Oral  PainSc:                  Caitlinn Klinker

## 2020-01-11 NOTE — Progress Notes (Signed)
Occupational Therapy Treatment Patient Details Name: Todd Wilkinson MRN: [id number] DOB: 1925/05/12 Today's Date: 01/11/2020    History of present illness 86 year old Caucasian male with past medical history remarkable for subtle atrial fibrillation, HLD, essential hypertension, chronic constipation, BPH and admitted for AKI. Pt s/p bilat ureteral stents 01/09/20   OT comments  Patient educated on safety techniques, arm placement for sit to stand transfers and compensatory strategies for donning B LE socks while sitting EOB. Patient was able to don B LE socks with extra time requiring assist with threading R LE sock. Patient has decreased awareness of deficits, but was able to follow safety commands. Patient has demonstrated progress with grooming while sitting in recliner with setup only functional transfer with RW at Moderate assist. Patient required 2 attempts for sit to stand transfer, but was able to self-correct arm placement for use with RW. Patient will benefit from continued skilled acute OT services.    Follow Up Recommendations  SNF    Equipment Recommendations  Tub/shower bench    Recommendations for Other Services      Precautions / Restrictions Restrictions Weight Bearing Restrictions: No       Mobility Bed Mobility Overal bed mobility: Needs Assistance Bed Mobility: Supine to Sit     Supine to sit: Min assist        Transfers Overall transfer level: Needs assistance Equipment used: Rolling walker (2 wheeled) Transfers: Sit to/from Stand Sit to Stand: Mod assist              Balance     Sitting balance-Leahy Scale: Fair       Standing balance-Leahy Scale: Poor                             ADL either performed or assessed with clinical judgement   ADL       Grooming: Oral care;Wash/dry face;Wash/dry hands;Set up               Lower Body Dressing: Minimal assistance                 General ADL Comments: education  on energy conservation techniques for donning B LE socks     Vision       Perception     Praxis      Cognition Arousal/Alertness: Awake/alert Behavior During Therapy: Impulsive                                            Exercises     Shoulder Instructions       General Comments      Pertinent Vitals/ Pain          Home Living                                          Prior Functioning/Environment              Frequency           Progress Toward Goals  OT Goals(current goals can now be found in the care plan section)  Progress towards OT goals: Progressing toward goals  Acute Rehab OT Goals Patient Stated Goal: to go home  Plan Discharge plan remains appropriate  Co-evaluation                 AM-PAC OT "6 Clicks" Daily Activity     Outcome Measure   Help from another person eating meals?: A Little Help from another person taking care of personal grooming?: A Little Help from another person toileting, which includes using toliet, bedpan, or urinal?: A Lot Help from another person bathing (including washing, rinsing, drying)?: A Lot Help from another person to put on and taking off regular upper body clothing?: A Little Help from another person to put on and taking off regular lower body clothing?: A Little 6 Click Score: 16    End of Session Equipment Utilized During Treatment: Gait belt      Activity Tolerance Patient tolerated treatment well   Patient Left in chair;with call bell/phone within reach;with chair alarm set   Nurse Communication Mobility status        Time: [id number] OT Time Calculation (min): 27 min  Charges: OT General Charges $OT Visit: 1 Visit OT Treatments $Self Care/Home Management : 8-22 mins $Therapeutic Activity: 8-22 mins  Miamarie Moll OTR/L    [facility] 01/11/2020, 3:06 PM

## 2020-01-12 LAB — RENAL FUNCTION PANEL
Albumin: 2.7 g/dL — ABNORMAL LOW (ref 3.5–5.0)
Anion gap: 8 (ref 5–15)
BUN: 54 mg/dL — ABNORMAL HIGH (ref 8–23)
CO2: 19 mmol/L — ABNORMAL LOW (ref 22–32)
Calcium: 8.2 mg/dL — ABNORMAL LOW (ref 8.9–10.3)
Chloride: 118 mmol/L — ABNORMAL HIGH (ref 98–111)
Creatinine, Ser: 1.54 mg/dL — ABNORMAL HIGH (ref 0.61–1.24)
GFR calc Af Amer: 45 mL/min — ABNORMAL LOW (ref >60)
GFR calc non Af Amer: 39 mL/min — ABNORMAL LOW (ref >60)
Glucose, Bld: 112 mg/dL — ABNORMAL HIGH (ref 70–99)
Phosphorus: 2.6 mg/dL (ref 2.5–4.6)
Potassium: 4.3 mmol/L (ref 3.5–5.1)
Sodium: 145 mmol/L (ref 135–145)

## 2020-01-12 NOTE — Progress Notes (Addendum)
Writer received notification from Tele that pt is having 2.8 sec pause at 01:57 PM. RN checked on pt and he is sleeping, easily arousable. Family is at bed side.  MD is notified.

## 2020-01-12 NOTE — Progress Notes (Signed)
PROGRESS NOTE    Todd Wilkinson  [id number] DOB: June 11, 1925 DOA: 01/07/2020 PCP: Deland Pretty, MD    Brief Narrative:   Todd Wilkinson is a 86 year old Caucasian male with past medical history remarkable for subtle atrial fibrillation, HLD, essential hypertension, chronic constipation, BPH who presented to the ED complaining of bladder discomfort x1 week.  He also states feels that he has trouble emptying his bladder.  Recently seen at urgent care 2 weeks ago, diagnosed with UTI and started on ciprofloxacin; and despite treatment, symptoms of hesitancy and dysuria continued.  He completed 7/14 days of ciprofloxacin and was taking Azo to help with symptoms; with change in his urine color to orange.  In the ED, temperature 97.5, HR 80, RR 17, BP 134/64, SPO2 90% on room air.  Sodium 133, CO2 17, BUN 112, creatinine 5.8, WBC count 16.9.  Renal ultrasound notable for moderate left hydronephrosis, 4.3 cm mass left lower quadrant described as possible enlarged lymph node.  Patient was given morphine, ceftriaxone, oxybutynin and referred for admission given acute renal failure, left hydronephrosis, left lower quadrant mass, and UTI for further treatment and evaluation.   Assessment & Plan:   Principal Problem:   AKI (acute kidney injury) ([place]) Active Problems:   AF (paroxysmal atrial fibrillation) ([facility])   Essential hypertension   Hyperlipidemia   BPH (benign prostatic hyperplasia)   Urinary tract infection   Abdominal mass, LLQ (left lower quadrant)   Acute renal failure Obstructive uropathy with Bilateral hydronephrosis Patient presenting with bladder pain and decreased urine output.  Creatinine noted to be elevated to 5.80 and BUN 112 with a previous baseline on review of EMR of 0.98 in October 2019.  Patient with history of BPH on tamsulosin.  Bladder scan in the ED notable for >961mL retained urine in Foley catheter was placed.  Renal ultrasound notable for moderate left  hydronephrosis with debris in renal pelvis.  CT abdomen/pelvis with mild symmetric hydronephrosis, no stones, hyperdense material left UPJ, proximal ureter and distal right ureter suggestive of of hemorrhagic material and bilateral perinephric stranding. --Urology nephrology following, appreciate assistance --s/p cystoscopy with bilateral ureteral stent placement on 01/09/2020 --Cr 5.80-->5.18-->7.06-->4.81-->2.68-->1.54 --BUN 112-->118-->127-->110-->79-->54 --UOP past 24hr 3,152mL --continue IVF hydration with NS at 125 mL per hour --Continue to hold home ARB; nephrology recommends holding for 3-4 wks post discharge --Avoid nephrotoxins, renally dose all medications --Strict I's and O's --Plan voiding trial 2/26 am per urology  Urinary tract infection Pyelonephritis Was on ciprofloxacin completed 7/14 days of outpatient treatment.  Urinalysis with small leukoesterase, positive nitrite, few bacteria, greater than 50 RBCs, and 11-20 WBCs.  Urine culture with no growth.  CT abdomen/pelvis notable for bilateral perinephric stranding consistent with pyelonephritis. --Urine culture 01/09/2020 during cystoscopy: No growth, although was on antibiotics for 7 days prior to admission --Continue ceftriaxone 2 g IV every 24 hours  Left lower quadrant abdominal mass 2/2 periaortic lymph node Renal ultrasound with 4.3 cm mass left lower quadrant, questionable enlarged lymph node. CT abdomen/pelvis without contrast notable for a 2.7 cm left periaortic lymph node  Paroxysmal atrial fibrillation On Xarelto 50 mg p.o. daily outpatient, not on any rate controlling agents. --holding home xarelto with renal function decline --Heparin drip discontinued on 01/08/2020 given likely blood clot within the ureter --continue to monitor on telemetry --Likely will hold anticoagulation on discharge until follows up with PCP and urology after discharge  Hyperlipidemia --holding home statin  Essential hypertension BP  153/73 this am; stable --Holding home losartan secondary  to AKI; will hold 3-4 weeks following discharge per nephrology recommendation --Hydralazine prn  BPH Review of EMR notable for PSA of 20 on 2019. --repeat PSA elevated to 72 --Increased tamsulosin to 0.4 mg p.o. twice daily and started on finasteride --Urology following  Sundowning --Delirium precautions, lights on during the day, off at night --Melatonin 3 mg p.o. qHS --Trazodone 50 mg p.o. nightly   DVT prophylaxis: SCD's Code Status: DNR Family Communication: updated patients daughter Amy via telephone this morning Disposition Plan: From home, lives alone.  Remain inpatient, continue IV fluids, close monitoring of renal function with strict I's and O's and Foley catheter.  Plan voiding trial tomorrow, anticipate discharge home on 01/14/2020 as long as renal function continues to improve and no further obstruction.   Consultants:   Urology - Dr. Lovena Neighbours  Nephrology - Dr. Jonnie Finner  Procedures:   Cystoscopy with bilateral ureteral stent placement 01/09/2020; Dr. Lovena Neighbours  Antimicrobials:   Ceftriaxone 2/20>>   Subjective: Patient seen and examined at bedside, resting comfortably.  Renal function improving.  Confusion continues to slowly improve daily, but with still with intermittent episodes of confusion per nursing staff.  No specific complaints this morning.  Noted Foley catheter with blood-tinged urine, although improving daily.  Denies headache, no chest pain, no palpitations, no shortness of breath, no abdominal pain, no weakness, no fatigue.  No other acute events overnight per nursing staff.  Objective: Vitals:   01/11/20 0435 01/11/20 1251 01/11/20 2046 01/12/20 0444  BP: 101/88 (!) 147/62 (!) 152/86 (!) 153/77  Pulse: 68 70 62 67  Resp: 16 16 20 18   Temp: (!) 97.1 F (36.2 C) 97.6 F (36.4 C) 97.8 F (36.6 C) 97.7 F (36.5 C)  TempSrc: Oral Oral Oral Oral  SpO2: 97% 99% 99% 97%  Weight:      Height:         Intake/Output Summary (Last 24 hours) at 01/12/2020 1109 Last data filed at 01/12/2020 0900 Gross per 24 hour  Intake 3192.41 ml  Output 3175 ml  Net 17.41 ml   Filed Weights   01/08/20 2331  Weight: 74.5 kg    Examination:  General exam: Appears calm and comfortable Respiratory system: Clear to auscultation. Respiratory effort normal. Oxygenating well on room air Cardiovascular system: S1 & S2 heard, RRR. No JVD, murmurs, rubs, gallops or clicks. No pedal edema. Gastrointestinal system: Abdomen is nondistended, soft and nontender. No organomegaly or masses felt. Normal bowel sounds heard. GU: Foley noted with slight red urine in bag Central nervous system: Alert, oriented to name, time and person (2021/[place]), place  [facility]). No focal neurological deficits. Extremities: Symmetric 5 x 5 power. Skin: No rashes, lesions or ulcers Psychiatry: Judgement and insight appear poor. Mood & affect appropriate.     Data Reviewed: I have personally reviewed following labs and imaging studies  CBC: Recent Labs  Lab 01/07/20 1219 01/08/20 0215 01/09/20 0443 01/10/20 0414 01/11/20 0416  WBC 16.9* 15.6* 11.9* 6.9 9.2  NEUTROABS 12.7*  --   --   --   --   HGB 11.6* 11.1* 10.0* 10.0* 10.2*  HCT 34.8* 32.2* 29.6* 29.5* 32.1*  MCV 101.5* 99.4 102.1* 102.4* 105.2*  PLT 136* 126* 114* 115* [id number]   Basic Metabolic Panel: Recent Labs  Lab 01/08/20 0215 01/09/20 0443 01/10/20 0414 01/11/20 0416 01/12/20 0450  NA 133* 132* 136 141 145  K 4.1 4.5 4.8 4.2 4.3  CL 102 104 110 115* 118*  CO2 16* 15* 15* 19*  19*  GLUCOSE 106* 106* 131* 108* 112*  BUN 118* 127* 110* 79* 54*  CREATININE 5.18* 7.06* 4.81* 2.68* 1.54*  CALCIUM 8.1* 7.8* 8.3* 8.4* 8.2*  PHOS 6.5* 7.6* 6.8* 3.8 2.6   GFR: Estimated Creatinine Clearance: 30.6 mL/min (A) (by C-G formula based on SCr of 1.54 mg/dL (H)). Liver Function Tests: Recent Labs  Lab 01/07/20 1219 01/07/20 1219 01/08/20 0215  01/09/20 0443 01/10/20 0414 01/11/20 0416 01/12/20 0450  AST 22  --  18  --   --   --   --   ALT 25  --  22  --   --   --   --   ALKPHOS 76  --  72  --   --   --   --   BILITOT 4.1*  --  3.3*  --   --   --   --   PROT 6.4*  --  5.9*  --   --   --   --   ALBUMIN 3.1*   < > 2.7*  2.8* 2.4* 2.4* 2.5* 2.7*   < > = values in this interval not displayed.   No results for input(s): LIPASE, AMYLASE in the last 168 hours. No results for input(s): AMMONIA in the last 168 hours. Coagulation Profile: Recent Labs  Lab 01/08/20 0215  INR 2.5*   Cardiac Enzymes: No results for input(s): CKTOTAL, CKMB, CKMBINDEX, TROPONINI in the last 168 hours. BNP (last 3 results) No results for input(s): PROBNP in the last 8760 hours. HbA1C: No results for input(s): HGBA1C in the last 72 hours. CBG: No results for input(s): GLUCAP in the last 168 hours. Lipid Profile: No results for input(s): CHOL, HDL, LDLCALC, TRIG, CHOLHDL, LDLDIRECT in the last 72 hours. Thyroid Function Tests: No results for input(s): TSH, T4TOTAL, FREET4, T3FREE, THYROIDAB in the last 72 hours. Anemia Panel: No results for input(s): VITAMINB12, FOLATE, FERRITIN, TIBC, IRON, RETICCTPCT in the last 72 hours. Sepsis Labs: No results for input(s): PROCALCITON, LATICACIDVEN in the last 168 hours.  Recent Results (from the past 240 hour(s))  Urine culture     Status: None   Collection Time: 01/07/20 11:25 AM   Specimen: Urine, Random  Result Value Ref Range Status   Specimen Description   Final    URINE, RANDOM Performed at [facility] [address] [place], [place] [address]    Special Requests   Final    NONE Performed at [facility], [place] [address], [place], [place] [address]    Culture   Final    NO GROWTH Performed at [facility], [place] [address], [place], [place] [address]    Report Status 01/08/2020 FINAL  Final  SARS CORONAVIRUS 2 (TAT 6-24 HRS) Nasopharyngeal  Nasopharyngeal Swab     Status: None   Collection Time: 01/07/20  2:56 PM   Specimen: Nasopharyngeal Swab  Result Value Ref Range Status   SARS Coronavirus 2 NEGATIVE NEGATIVE Final    Comment: (NOTE) SARS-CoV-2 target nucleic acids are NOT DETECTED. The SARS-CoV-2 RNA is generally detectable in upper and lower respiratory specimens during the acute phase of infection. Negative results do not preclude SARS-CoV-2 infection, do not rule out co-infections with other pathogens, and should not be used as the sole basis for treatment or other patient management decisions. Negative results must be combined with clinical observations, patient history, and epidemiological information. The expected result is Negative. Fact Sheet for Patients: SugarRoll.be Fact Sheet for Healthcare Providers:  https://www.woods-mathews.com/ This test is not yet approved or cleared by the [place] and  has been authorized for detection and/or diagnosis of SARS-CoV-2 by FDA under an Emergency Use Authorization (EUA). This EUA will remain  in effect (meaning this test can be used) for the duration of the COVID-19 declaration under Section 56 4(b)(1) of the Act, 21 U.S.C. section 360bbb-3(b)(1), unless the authorization is terminated or revoked sooner. Performed at [facility], [place] [address] [place], [place] [address]   Urine Culture     Status: None   Collection Time: 01/09/20  1:13 PM   Specimen: Urine, Random  Result Value Ref Range Status   Specimen Description   Final    URINE, RANDOM CYTOSCOPE Performed at [facility], [place] [address] [place], [place] [address]    Special Requests   Final    NONE Performed at [facility], [place] [address] [place], [place] [address]    Culture   Final    NO GROWTH Performed at [facility], [place] [address] [place], [place] [address]    Report Status 01/10/2020  FINAL  Final         Radiology Studies: No results found.      Scheduled Meds: . B-complex with vitamin C  1 tablet Oral Daily  . Chlorhexidine Gluconate Cloth  6 each Topical Daily  . finasteride  5 mg Oral Daily  . LORazepam  0.5-1 mg Oral BID  . Melatonin  3 mg Oral QHS  . polyethylene glycol  17 g Oral Daily  . tamsulosin  0.4 mg Oral BID  . traZODone  50 mg Oral QHS   Continuous Infusions: . sodium chloride 125 mL/hr at 01/11/20 2242  . cefTRIAXone (ROCEPHIN)  IV 2 g (01/12/20 0816)     LOS: 5 days    Time spent: 35 minutes spent on chart review, discussion with nursing staff, consultants, updating family and interview/physical exam; more than 50% of that time was spent in counseling and/or coordination of care.    Hibah Odonnell J [place] DO [facility] Available via Epic secure chat 7am-7pm After these hours, please refer to coverage provider listed on [email or web address] 01/12/2020, 11:09 AM

## 2020-01-12 NOTE — Progress Notes (Signed)
Writer received call from tele, stating that pt HR is in 30's. Writer checked the patient, he was resting, VSS, not in any distress, denies pain. Closely monitoring the pt.

## 2020-01-12 NOTE — Progress Notes (Signed)
Physical Therapy Treatment Patient Details Name: Todd Wilkinson MRN: [id number] DOB: 02-Jan-1925 Today's Date: 01/12/2020    History of Present Illness 86 year old Caucasian male with past medical history remarkable for subtle atrial fibrillation, HLD, essential hypertension, chronic constipation, BPH and admitted for AKI. Pt s/p bilat ureteral stents 01/09/20    PT Comments    Pt attempting to get OOB on arrival reporting back pain.  Pt agreeable to up to recliner however declined ambulating at this time.  Pt's transfers improved since previous session however still requiring min assist.   Follow Up Recommendations  SNF     Equipment Recommendations  None recommended by PT    Recommendations for Other Services       Precautions / Restrictions Precautions Precautions: Fall    Mobility  Bed Mobility Overal bed mobility: Needs Assistance Bed Mobility: Supine to Sit     Supine to sit: Min assist     General bed mobility comments: pt attempting to wiggle out of bed upon passing room and states he wants to get off his back; assist for trunk upright  Transfers Overall transfer level: Needs assistance Equipment used: Rolling walker (2 wheeled) Transfers: Sit to/from Stand Sit to Stand: Min assist         General transfer comment: verbal cues for hand placement, assist to rise AND steady, no leaning observed today  Ambulation/Gait             General Gait Details: pt declined ambulating, agreeable to OOB to recliner though   Stairs             Wheelchair Mobility    Modified Rankin (Stroke Patients Only)       Balance                                            Cognition Arousal/Alertness: Awake/alert Behavior During Therapy: Impulsive Overall Cognitive Status: No family/caregiver present to determine baseline cognitive functioning                                 General Comments: impulsive however will follow  simple commands      Exercises      General Comments        Pertinent Vitals/Pain Pain Assessment: No/denies pain    Home Living                      Prior Function            PT Goals (current goals can now be found in the care plan section) Progress towards PT goals: Progressing toward goals    Frequency    Min 2X/week      PT Plan Current plan remains appropriate    Co-evaluation              AM-PAC PT "6 Clicks" Mobility   Outcome Measure  Help needed turning from your back to your side while in a flat bed without using bedrails?: A Little Help needed moving from lying on your back to sitting on the side of a flat bed without using bedrails?: A Little Help needed moving to and from a bed to a chair (including a wheelchair)?: A Little Help needed standing up from a chair using your arms (e.g., wheelchair or bedside chair)?: A  Little Help needed to walk in hospital room?: A Lot Help needed climbing 3-5 steps with a railing? : A Lot 6 Click Score: 16    End of Session Equipment Utilized During Treatment: Gait belt Activity Tolerance: Patient tolerated treatment well Patient left: in chair;with call bell/phone within reach;with chair alarm set;with nursing/sitter in room Nurse Communication: Mobility status PT Visit Diagnosis: Other abnormalities of gait and mobility (R26.89)     Time: [id number] PT Time Calculation (min) (ACUTE ONLY): 8 min  Charges:  $Therapeutic Activity: 8-22 mins                    Arlyce Dice, DPT Acute Rehabilitation Services Office: [phone]   York Ram E 01/12/2020, 2:20 PM

## 2020-01-12 NOTE — Progress Notes (Signed)
Pt is alert, confused at times. NT reported that Pt pulled his IV out. Pt not in distress, was redirectable.

## 2020-01-13 LAB — RENAL FUNCTION PANEL
Albumin: 2.5 g/dL — ABNORMAL LOW (ref 3.5–5.0)
Anion gap: 7 (ref 5–15)
BUN: 35 mg/dL — ABNORMAL HIGH (ref 8–23)
CO2: 21 mmol/L — ABNORMAL LOW (ref 22–32)
Calcium: 8.2 mg/dL — ABNORMAL LOW (ref 8.9–10.3)
Chloride: 119 mmol/L — ABNORMAL HIGH (ref 98–111)
Creatinine, Ser: 1.13 mg/dL (ref 0.61–1.24)
GFR calc Af Amer: >60 mL/min (ref >60)
GFR calc non Af Amer: 56 mL/min — ABNORMAL LOW (ref >60)
Glucose, Bld: 104 mg/dL — ABNORMAL HIGH (ref 70–99)
Phosphorus: 2.6 mg/dL (ref 2.5–4.6)
Potassium: 4.2 mmol/L (ref 3.5–5.1)
Sodium: 147 mmol/L — ABNORMAL HIGH (ref 135–145)

## 2020-01-13 NOTE — Care Management Important Message (Signed)
Important Message  Patient Details IM Letter given to Dessa Phi RN Case Manager to present to the Patient Name: Todd Wilkinson MRN: [id number] Date of Birth: [birth year]   Medicare Important Message Given:  Yes     Kerin Salen 01/13/2020, 10:49 AM

## 2020-01-13 NOTE — Progress Notes (Signed)
Patient to be bladder scanned at next void or 2000, whichever is first. If patient has >381ml on bladder scanner, foley catheter is to be replaced per Dr. [place]  Patient's daughter was also concerned for patient coughing during eating/drinking, and patient voiced same concerns for himself.  Speech evaluation has been placed.  Will continue to monitor.

## 2020-01-13 NOTE — Progress Notes (Signed)
Patient's foley catheter removed this morning at 1000.  Patient has not voided.  Bladder scan shows 540 ml and 601 ml.  Dr. [place] notified via text page and gave order to in and out cath patient.

## 2020-01-13 NOTE — Progress Notes (Signed)
In and out cath completed with 700 ml of urine returned.  Patient tolerated well.  Will continue to monitor.

## 2020-01-13 NOTE — Progress Notes (Signed)
PROGRESS NOTE    Todd Wilkinson  [id number] DOB: August 23, 1925 DOA: 01/07/2020 PCP: Deland Pretty, MD    Brief Narrative:   Todd Wilkinson is a 86 year old Caucasian male with past medical history remarkable for subtle atrial fibrillation, HLD, essential hypertension, chronic constipation, BPH who presented to the ED complaining of bladder discomfort x1 week.  He also states feels that he has trouble emptying his bladder.  Recently seen at urgent care 2 weeks ago, diagnosed with UTI and started on ciprofloxacin; and despite treatment, symptoms of hesitancy and dysuria continued.  He completed 7/14 days of ciprofloxacin and was taking Azo to help with symptoms; with change in his urine color to orange.  In the ED, temperature 97.5, HR 80, RR 17, BP 134/64, SPO2 90% on room air.  Sodium 133, CO2 17, BUN 112, creatinine 5.8, WBC count 16.9.  Renal ultrasound notable for moderate left hydronephrosis, 4.3 cm mass left lower quadrant described as possible enlarged lymph node.  Patient was given morphine, ceftriaxone, oxybutynin and referred for admission given acute renal failure, left hydronephrosis, left lower quadrant mass, and UTI for further treatment and evaluation.   Assessment & Plan:   Principal Problem:   AKI (acute kidney injury) ([place]) Active Problems:   AF (paroxysmal atrial fibrillation) ([facility])   Essential hypertension   Hyperlipidemia   BPH (benign prostatic hyperplasia)   Urinary tract infection   Abdominal mass, LLQ (left lower quadrant)   Acute renal failure Obstructive uropathy with Bilateral hydronephrosis Patient presenting with bladder pain and decreased urine output.  Creatinine noted to be elevated to 5.80 and BUN 112 with a previous baseline on review of EMR of 0.98 in October 2019.  Patient with history of BPH on tamsulosin.  Bladder scan in the ED notable for >989mL retained urine in Foley catheter was placed.  Renal ultrasound notable for moderate left  hydronephrosis with debris in renal pelvis.  CT abdomen/pelvis with mild symmetric hydronephrosis, no stones, hyperdense material left UPJ, proximal ureter and distal right ureter suggestive of of hemorrhagic material and bilateral perinephric stranding. --Urology and nephrology following, appreciate assistance --s/p cystoscopy with bilateral ureteral stent placement on 01/09/2020 --Cr 5.80-->5.18-->7.06-->4.81-->2.68-->1.54-->1.13 --BUN 112-->118-->127-->110-->79-->54-->35 --UOP past 24hr 2,153mL --continue IVF hydration with NS at 125 mL per hour --Continue to hold home ARB; nephrology recommends holding for 3-4 wks post discharge --Avoid nephrotoxins, renally dose all medications --Strict I's and O's --Plan voiding trial today, bladder scan at next void or if no void in 4 hours  Urinary tract infection Pyelonephritis Was on ciprofloxacin completed 7/14 days of outpatient treatment.  Urinalysis with small leukoesterase, positive nitrite, few bacteria, greater than 50 RBCs, and 11-20 WBCs.  Urine culture with no growth.  CT abdomen/pelvis notable for bilateral perinephric stranding consistent with pyelonephritis. Urine culture 01/09/2020 during cystoscopy: No growth, although was on antibiotics for 7 days prior to admission.  Completed 5-day course of ceftriaxone.  Left lower quadrant abdominal mass 2/2 periaortic lymph node Renal ultrasound with 4.3 cm mass left lower quadrant, questionable enlarged lymph node. CT abdomen/pelvis without contrast notable for a 2.7 cm left periaortic lymph node  Paroxysmal atrial fibrillation On Xarelto 50 mg p.o. daily outpatient, not on any rate controlling agents. --holding home xarelto with renal function decline --Heparin drip discontinued on 01/08/2020 given likely blood clot within the ureter --continue to monitor on telemetry --Likely will hold anticoagulation on discharge until follows up with PCP and urology  Hyperlipidemia --holding home  statin  Essential hypertension BP 153/73 this  am; stable --Holding home losartan secondary to AKI; will hold 3-4 weeks following discharge per nephrology recommendation --Hydralazine prn  BPH Review of EMR notable for PSA of 20 on 2019. --repeat PSA elevated to 72 --Increased tamsulosin to 0.4 mg p.o. twice daily and started on finasteride --Urology following  Sundowning --Delirium precautions, lights on during the day, off at night --Melatonin 3 mg p.o. qHS --Trazodone 50 mg p.o. nightly   DVT prophylaxis: SCD's Code Status: DNR Family Communication: updated patients daughter Amy via telephone this morning Disposition Plan: From home, lives alone.  Remain inpatient, continue IV fluids, close monitoring of renal function with strict I's and O's and Foley catheter.  Voiding trial today, anticipate discharge home on 01/14/2020 as long as renal function continues to improve and no further obstruction.   Consultants:   Urology - Dr. Lovena Neighbours  Nephrology - Dr. Jonnie Finner  Procedures:   Cystoscopy with bilateral ureteral stent placement 01/09/2020; Dr. Lovena Neighbours  Antimicrobials:   Ceftriaxone 2/20>>   Subjective: Patient seen and examined at bedside, resting comfortably.  Renal function improving.  Confusion continues to slowly improve daily, but with still with intermittent episodes of confusion per nursing staff.  No specific complaints this morning.  Denies headache, no chest pain, no palpitations, no shortness of breath, no abdominal pain, no weakness, no fatigue.  No other acute events overnight per nursing staff.  Objective: Vitals:   01/12/20 2014 01/12/20 2151 01/13/20 0038 01/13/20 0427  BP: (!) 169/73 (!) 151/97 (!) 143/77 (!) 160/77  Pulse: 71  63 78  Resp: 18   18  Temp: 97.7 F (36.5 C)   97.6 F (36.4 C)  TempSrc: Oral   Oral  SpO2: 97%   99%  Weight:      Height:        Intake/Output Summary (Last 24 hours) at 01/13/2020 1103 Last data filed at 01/13/2020  0900 Gross per 24 hour  Intake 1517.54 ml  Output 2325 ml  Net -807.46 ml   Filed Weights   01/08/20 2331  Weight: 74.5 kg    Examination:  General exam: Appears calm and comfortable Respiratory system: Clear to auscultation. Respiratory effort normal. Oxygenating well on room air Cardiovascular system: S1 & S2 heard, RRR. No JVD, murmurs, rubs, gallops or clicks. No pedal edema. Gastrointestinal system: Abdomen is nondistended, soft and nontender. No organomegaly or masses felt. Normal bowel sounds heard. GU: Foley noted with dark urine in bag Central nervous system: Alert, oriented to name, time and person (2021/[place]), place  [facility]). No focal neurological deficits. Extremities: Symmetric 5 x 5 power. Skin: No rashes, lesions or ulcers Psychiatry: Judgement and insight appear poor. Mood & affect appropriate.     Data Reviewed: I have personally reviewed following labs and imaging studies  CBC: Recent Labs  Lab 01/07/20 1219 01/08/20 0215 01/09/20 0443 01/10/20 0414 01/11/20 0416  WBC 16.9* 15.6* 11.9* 6.9 9.2  NEUTROABS 12.7*  --   --   --   --   HGB 11.6* 11.1* 10.0* 10.0* 10.2*  HCT 34.8* 32.2* 29.6* 29.5* 32.1*  MCV 101.5* 99.4 102.1* 102.4* 105.2*  PLT 136* 126* 114* 115* [id number]   Basic Metabolic Panel: Recent Labs  Lab 01/09/20 0443 01/10/20 0414 01/11/20 0416 01/12/20 0450 01/13/20 0335  NA 132* 136 141 145 147*  K 4.5 4.8 4.2 4.3 4.2  CL 104 110 115* 118* 119*  CO2 15* 15* 19* 19* 21*  GLUCOSE 106* 131* 108* 112* 104*  BUN 127* 110*  79* 54* 35*  CREATININE 7.06* 4.81* 2.68* 1.54* 1.13  CALCIUM 7.8* 8.3* 8.4* 8.2* 8.2*  PHOS 7.6* 6.8* 3.8 2.6 2.6   GFR: Estimated Creatinine Clearance: 41.7 mL/min (by C-G formula based on SCr of 1.13 mg/dL). Liver Function Tests: Recent Labs  Lab 01/07/20 1219 01/07/20 1219 01/08/20 0215 01/08/20 0215 01/09/20 0443 01/10/20 0414 01/11/20 0416 01/12/20 0450 01/13/20 0335  AST 22  --  18  --    --   --   --   --   --   ALT 25  --  22  --   --   --   --   --   --   ALKPHOS 76  --  72  --   --   --   --   --   --   BILITOT 4.1*  --  3.3*  --   --   --   --   --   --   PROT 6.4*  --  5.9*  --   --   --   --   --   --   ALBUMIN 3.1*   < > 2.7*  2.8*   < > 2.4* 2.4* 2.5* 2.7* 2.5*   < > = values in this interval not displayed.   No results for input(s): LIPASE, AMYLASE in the last 168 hours. No results for input(s): AMMONIA in the last 168 hours. Coagulation Profile: Recent Labs  Lab 01/08/20 0215  INR 2.5*   Cardiac Enzymes: No results for input(s): CKTOTAL, CKMB, CKMBINDEX, TROPONINI in the last 168 hours. BNP (last 3 results) No results for input(s): PROBNP in the last 8760 hours. HbA1C: No results for input(s): HGBA1C in the last 72 hours. CBG: No results for input(s): GLUCAP in the last 168 hours. Lipid Profile: No results for input(s): CHOL, HDL, LDLCALC, TRIG, CHOLHDL, LDLDIRECT in the last 72 hours. Thyroid Function Tests: No results for input(s): TSH, T4TOTAL, FREET4, T3FREE, THYROIDAB in the last 72 hours. Anemia Panel: No results for input(s): VITAMINB12, FOLATE, FERRITIN, TIBC, IRON, RETICCTPCT in the last 72 hours. Sepsis Labs: No results for input(s): PROCALCITON, LATICACIDVEN in the last 168 hours.  Recent Results (from the past 240 hour(s))  Urine culture     Status: None   Collection Time: 01/07/20 11:25 AM   Specimen: Urine, Random  Result Value Ref Range Status   Specimen Description   Final    URINE, RANDOM Performed at [facility] [address] [place], [place] [address]    Special Requests   Final    NONE Performed at [facility], [place] [address], [place], [place] [address]    Culture   Final    NO GROWTH Performed at [facility], [place] [address], [place], [place] [address]    Report Status 01/08/2020 FINAL  Final  SARS CORONAVIRUS 2 (TAT 6-24 HRS) Nasopharyngeal Nasopharyngeal Swab      Status: None   Collection Time: 01/07/20  2:56 PM   Specimen: Nasopharyngeal Swab  Result Value Ref Range Status   SARS Coronavirus 2 NEGATIVE NEGATIVE Final    Comment: (NOTE) SARS-CoV-2 target nucleic acids are NOT DETECTED. The SARS-CoV-2 RNA is generally detectable in upper and lower respiratory specimens during the acute phase of infection. Negative results do not preclude SARS-CoV-2 infection, do not rule out co-infections with other pathogens, and should not be used as the sole basis for treatment or other patient management decisions. Negative results must  be combined with clinical observations, patient history, and epidemiological information. The expected result is Negative. Fact Sheet for Patients: SugarRoll.be Fact Sheet for Healthcare Providers: https://www.woods-mathews.com/ This test is not yet approved or cleared by the [place] FDA and  has been authorized for detection and/or diagnosis of SARS-CoV-2 by FDA under an Emergency Use Authorization (EUA). This EUA will remain  in effect (meaning this test can be used) for the duration of the COVID-19 declaration under Section 56 4(b)(1) of the Act, 21 U.S.C. section 360bbb-3(b)(1), unless the authorization is terminated or revoked sooner. Performed at [facility], [place] [address] [place], [place] [address]   Urine Culture     Status: None   Collection Time: 01/09/20  1:13 PM   Specimen: Urine, Random  Result Value Ref Range Status   Specimen Description   Final    URINE, RANDOM CYTOSCOPE Performed at [facility], [place] [address] [place], [place] [address]    Special Requests   Final    NONE Performed at [facility], [place] [address], [place], [place] [address]    Culture   Final    NO GROWTH Performed at [facility], [place] [address], [place],  [address]    Report Status 01/10/2020 FINAL  Final          Radiology Studies: No results found.      Scheduled Meds: . B-complex with vitamin C  1 tablet Oral Daily  . Chlorhexidine Gluconate Cloth  6 each Topical Daily  . finasteride  5 mg Oral Daily  . LORazepam  0.5-1 mg Oral BID  . Melatonin  3 mg Oral QHS  . polyethylene glycol  17 g Oral Daily  . tamsulosin  0.4 mg Oral BID  . traZODone  50 mg Oral QHS   Continuous Infusions: . sodium chloride 125 mL/hr at 01/13/20 0035     LOS: 6 days    Time spent: 34 minutes spent on chart review, discussion with nursing staff, consultants, updating family and interview/physical exam; more than 50% of that time was spent in counseling and/or coordination of care.    Eean Buss J [place] DO [facility] Available via Epic secure chat 7am-7pm After these hours, please refer to coverage provider listed on [email or web address] 01/13/2020, 11:03 AM

## 2020-01-14 DIAGNOSIS — R1904 Left lower quadrant abdominal swelling, mass and lump: Secondary | ICD-10-CM

## 2020-01-14 DIAGNOSIS — N3001 Acute cystitis with hematuria: Secondary | ICD-10-CM

## 2020-01-14 LAB — CBC
HCT: 31.1 % — ABNORMAL LOW (ref 39.0–52.0)
Hemoglobin: 10.1 g/dL — ABNORMAL LOW (ref 13.0–17.0)
MCH: 34.1 pg — ABNORMAL HIGH (ref 26.0–34.0)
MCHC: 32.5 g/dL (ref 30.0–36.0)
MCV: 105.1 fL — ABNORMAL HIGH (ref 80.0–100.0)
Platelets: 159 10*3/uL (ref 150–400)
RBC: 2.96 MIL/uL — ABNORMAL LOW (ref 4.22–5.81)
RDW: 14.6 % (ref 11.5–15.5)
WBC: 10.3 10*3/uL (ref 4.0–10.5)
nRBC: 0 % (ref 0.0–0.2)

## 2020-01-14 LAB — RENAL FUNCTION PANEL
Albumin: 2.7 g/dL — ABNORMAL LOW (ref 3.5–5.0)
Anion gap: 7 (ref 5–15)
BUN: 29 mg/dL — ABNORMAL HIGH (ref 8–23)
CO2: 21 mmol/L — ABNORMAL LOW (ref 22–32)
Calcium: 8.2 mg/dL — ABNORMAL LOW (ref 8.9–10.3)
Chloride: 115 mmol/L — ABNORMAL HIGH (ref 98–111)
Creatinine, Ser: 0.95 mg/dL (ref 0.61–1.24)
GFR calc Af Amer: >60 mL/min (ref >60)
GFR calc non Af Amer: >60 mL/min (ref >60)
Glucose, Bld: 91 mg/dL (ref 70–99)
Phosphorus: 2.3 mg/dL — ABNORMAL LOW (ref 2.5–4.6)
Potassium: 4 mmol/L (ref 3.5–5.1)
Sodium: 143 mmol/L (ref 135–145)

## 2020-01-14 LAB — BASIC METABOLIC PANEL
Anion gap: 7 (ref 5–15)
BUN: 28 mg/dL — ABNORMAL HIGH (ref 8–23)
CO2: 22 mmol/L (ref 22–32)
Calcium: 8.3 mg/dL — ABNORMAL LOW (ref 8.9–10.3)
Chloride: 115 mmol/L — ABNORMAL HIGH (ref 98–111)
Creatinine, Ser: 0.81 mg/dL (ref 0.61–1.24)
GFR calc Af Amer: >60 mL/min (ref >60)
GFR calc non Af Amer: >60 mL/min (ref >60)
Glucose, Bld: 92 mg/dL (ref 70–99)
Potassium: 4 mmol/L (ref 3.5–5.1)
Sodium: 144 mmol/L (ref 135–145)

## 2020-01-14 MED ORDER — FINASTERIDE 5 MG PO TABS: 5.0000 mg | ORAL_TABLET | Freq: Every day | ORAL | 3 refills | Status: AC

## 2020-01-14 MED ORDER — RIVAROXABAN 15 MG PO TABS: 15.0000 mg | ORAL_TABLET | Freq: Every day | ORAL | 6 refills | Status: DC

## 2020-01-14 MED ORDER — RESOURCE THICKENUP CLEAR PO POWD
ORAL | Status: DC | PRN
  Filled 2020-01-14: qty 125

## 2020-01-14 MED ORDER — TAMSULOSIN HCL 0.4 MG PO CAPS: 0.4000 mg | ORAL_CAPSULE | Freq: Two times a day (BID) | ORAL | 2 refills | Status: DC

## 2020-01-14 NOTE — Evaluation (Signed)
Clinical/Bedside Swallow Evaluation Patient Details  Name: Todd Wilkinson MRN: [id number] Date of Birth: 09-14-1925  Today's Date: 01/14/2020 Time: SLP Start Time (ACUTE ONLY): [id number] SLP Stop Time (ACUTE ONLY): 1655 SLP Time Calculation (min) (ACUTE ONLY): 20 min  Past Medical History:  Past Medical History:  Diagnosis Date  . Hyperlipidemia    Past Surgical History:  Past Surgical History:  Procedure Laterality Date  . BACK SURGERY    . CYSTOSCOPY WITH STENT PLACEMENT Bilateral 01/09/2020   Procedure: CYSTOSCOPY WITH RETROGRADE PYELOGRAM AND STENT PLACEMENT;  Surgeon: Ceasar Mons, MD;  Location: [facility];  Service: Urology;  Laterality: Bilateral;   HPI:  86 year old Caucasian male with past medical history remarkable for subtle atrial fibrillation, HLD, essential hypertension, chronic constipation, BPH and admitted for AKI. Pt s/p bilat ureteral stents 01/09/20   Assessment / Plan / Recommendation Clinical Impression  Pt was seen for a bedside swallow evaluation in the setting of reported coughing/choking with thin liquid.  Pt was encountered awake/alert and he was pleasant throughout this evaluation.  Pt reported that he has experienced occasional coughing and globus sensation with thin liquid and hard solids "for a little while".  Pt was unable to specify further.  He denied hx of GERD.  Oral mechanism exam was [facility].  Pt consumed trials of thin liquid, nectar-thick liquid, puree, and regular solids.  He exhibited an immediate throat clear following 1/2 tsp of thin liquid and immediate cough following 2/3 cup sips and 1/3 straw sips of thin liquid.  Limiting bolus size did not help to eliminate clinical s/sx of aspiration.  He additionally exhibited prolonged mastication of regular solids with an immediate throat clear following swallow initiation.  No overt s/sx of aspiration were observed with nectar-thick liquid trials (cup/straw) or puree trials.  Pt was observed to have  multiple swallows per bolus, possibly indicating pharyngeal residue and/or esophageal dysfunction.  He additionally reported globus sensation with most PO trials.  Recommend diet change to Dysphagia 3 (soft) solids and nectar-thick liquids with medications administered whole in puree.  Pt would benefit from an instrumental swallow study to further evaluate his swallow function.  Additionally recommend home health ST at time of discharge targeting dysphagia.  SLP will f/u per POC.    SLP Visit Diagnosis: Dysphagia, unspecified (R13.10)    Aspiration Risk  Mild aspiration risk    Diet Recommendation Dysphagia 3 (Mech soft);Nectar-thick liquid   Liquid Administration via: Cup;Straw Medication Administration: Whole meds with puree Supervision: Staff to assist with self feeding;Intermittent supervision to cue for compensatory strategies Compensations: Minimize environmental distractions;Slow rate;Small sips/bites Postural Changes: Seated upright at 90 degrees    Other  Recommendations Oral Care Recommendations: Oral care BID Other Recommendations: Remove water pitcher;Order thickener from pharmacy   Follow up Recommendations Home health SLP      Frequency and Duration min 2x/week  2 weeks       Prognosis Prognosis for Safe Diet Advancement: Fair      Swallow Study   General HPI: 86 year old Caucasian male with past medical history remarkable for subtle atrial fibrillation, HLD, essential hypertension, chronic constipation, BPH and admitted for AKI. Pt s/p bilat ureteral stents 01/09/20 Type of Study: Bedside Swallow Evaluation Previous Swallow Assessment: None  Diet Prior to this Study: Dysphagia 3 (soft);Thin liquids Temperature Spikes Noted: No Respiratory Status: Room air History of Recent Intubation: No Behavior/Cognition: Alert;Cooperative;Pleasant mood Oral Cavity Assessment: Within Functional Limits Oral Care Completed by SLP: No Oral Cavity - Dentition: Adequate natural  dentition Vision: Functional for self-feeding Self-Feeding Abilities: Able to feed self;Needs set up Patient Positioning: Upright in bed Baseline Vocal Quality: Normal Volitional Cough: Strong Volitional Swallow: Able to elicit    Oral/Motor/Sensory Function Overall Oral Motor/Sensory Function: Within functional limits   Ice Chips Ice chips: Within functional limits Presentation: Spoon   Thin Liquid Thin Liquid: Impaired Presentation: Cup;Spoon;Straw;Self Fed Pharyngeal  Phase Impairments: Throat Clearing - Immediate;Cough - Immediate;Multiple swallows;Suspected delayed Swallow    Nectar Thick Nectar Thick Liquid: Impaired Presentation: Cup;Straw Pharyngeal Phase Impairments: Suspected delayed Swallow;Multiple swallows   Honey Thick Honey Thick Liquid: Not tested   Puree Puree: Impaired Presentation: Spoon Pharyngeal Phase Impairments: Suspected delayed Swallow;Multiple swallows   Solid     Solid: Impaired Presentation: Self Fed Oral Phase Impairments: Impaired mastication Oral Phase Functional Implications: Prolonged oral transit;Impaired mastication Pharyngeal Phase Impairments: Throat Clearing - Immediate;Multiple swallows     Colin Mulders M.S., CCC-SLP Acute Rehabilitation Services Office: [phone]  [place] 01/14/2020,5:10 PM

## 2020-01-14 NOTE — Progress Notes (Signed)
PT note, Spoke with RN, who just ambulated with patient. Plans are for DC home today. Family declines SNF. [place] Pager [phone] Office [phone]

## 2020-01-14 NOTE — Progress Notes (Signed)
I reviewed the telemetry rhythm strips, patient has had at most a 2.1-second ventricular standstill, however patient is in permanent atrial fibrillation.  His heart rate on average has been in 50s, he is totally asymptomatic without any syncopal episodes in the past, presently being admitted and being evaluated for UTI.  No contraindication for discharge from cardiac standpoint as long as heart rate is averaging 50 bpm.  Discussed with Dr. Tana Coast.   Adrian Prows, MD, [facility] 01/14/2020, 4:15 PM [facility] [place] Office: [phone]

## 2020-01-14 NOTE — Progress Notes (Signed)
[facility]                                                                              Patient Demographics  Todd Wilkinson, is a 86 y.o. male, DOB - March 10, 1925, [id number]  Admit date - 01/07/2020   Admitting Physician Mariel Aloe, MD  Outpatient Primary MD for the patient is Deland Pretty, MD  Outpatient specialists:   LOS - 7  days   Medical records reviewed and are as summarized below:    Chief Complaint  Patient presents with  . Bladder Pain       Brief summary   Todd Wilkinson is a 86 year old Caucasian male with past medical history remarkable for subtle atrial fibrillation, HLD, essential hypertension, chronic constipation, BPH who presented to the ED complaining of bladder discomfort x1 week.  He also states feels that he has trouble emptying his bladder.  Recently seen at urgent care 2 weeks ago, diagnosed with UTI and started on ciprofloxacin; and despite treatment, symptoms of hesitancy and dysuria continued.  He completed 7/14 days of ciprofloxacin and was taking Azo to help with symptoms; with change in his urine color to orange  Renal ultrasound showed moderate left hydronephrosis, 4.3 cm mass left lower quadrant described as possible enlarged lymph node.  Patient was placed on IV ceftriaxone and was referred for admission for AKI, left hydronephrosis, left lower quadrant mass, UTI  Assessment & Plan    Principal Problem: Bradycardia with a 2.1-second pause, history of paroxysmal A. fib with slow ventricular response -Patient was planned to discharge today and was noted to have heart rate down to 38, with 2.1-second pause improved to 40s.  No dizziness lightheadedness or syncopal episode.  EKG showed A. fib with slow ventricular response.  Patient is not on any rate controlling medications.   -Will hold discharge, discussed with patient's cardiologist, Dr. Einar Gip, will follow recommendations.  Updated patient's daughter. [name] Reasons was held,  with renal function decline.  Heparin drip discontinued on 2/21, likely due to hemorrhagic material noted on CT abdomen pelvis -will follow cardiology recommendations.  Continue to hold anticoagulation until recommended to restart by cardiology and urology  Acute kidney injury, obstructive uropathy with bilateral hydronephrosis -Patient presented with bladder pain, decreased urine output, creatinine elevated to 5.8, BUN 112.  Baseline 0.98 in October 2019.  History of BPH on Flomax. -Bladder scan in the ED > 999 cc retained urine.  Foley catheter was placed.  -Renal ultrasound showed moderate left hydronephrosis with debris's in renal pelvis.  CT abdomen pelvis with mild symmetric hydronephrosis, no stones, hyperdense material in left UPJ, proximal ureter, distal right ureter suggestive of hemorrhagic material and bilateral perinephric stranding -Nephrology and urology consulted, status post cystoscopy with bilateral ureteral stent placement on 2/22. -Creatinine improved--creatinine on admission 5.8, plateaued at 7.06, has been trending down, 0.95 today  -Patient was placed on IV hydration.  Continue to hold losartan, nephrology recommended holding for 3 to 4 weeks post discharge. -Voiding trial was done on 2/26, patient failed the trial, Foley catheter was reinserted.  Patient has an appointment with urology on 01/23/2020 with Dr. Gilford Rile.  Recommended  to keep Foley in until the urology appointment   UTI, pyelonephritis, bilateral -Patient was on ciprofloxacin completed 7/14 days of outpatient treatment.  Urine culture showed no growth -CT abdomen pelvis notable for bilateral perinephric stranding consistent with pyelonephritis. -Urine culture 2/22 during cystoscopy showed no growth however patient was on antibiotics for 7 days PTA -He has completed 5-day course of IV Rocephin.  Left lower quadrant abdominal mass secondary to periaortic lymph node Renal ultrasound with 4.3 cm mass left lower  quadrant questionable enlarged lymph node.  CT abdomen pelvis without contrast notable for 2.7 cm left periaortic lymph node.  Hyperlipidemia --Resume statin at discharge  Essential hypertension -BP stable, holding home losartan secondary to AKI.  Nephrology recommended holding 3 to 4 weeks following discharge. -Continue hydralazine as needed.   BPH -PSA total of 20 in 2019.  Repeat PSA elevated to 72 -Increase tamsulosin 2.4 mg twice daily and started on finasteride -Urology following and has outpatient appointment   Sundowning --Delirium precautions, lights on during the day, off at night --Melatonin 3 mg p.o. qHS --Trazodone 50 mg p.o. nightly    Code Status: DNR DVT Prophylaxis: SCDs Family Communication: Discussed all imaging results, lab results, explained to the patient and discussed with patient's daughter on the phone, updated again regarding bradycardia   Disposition Plan: Patient from home.  PT OT evaluation recommended skilled nursing facility.  Discussed with patient's daughter, Amy on the phone she requested to proceed with home health PT OT, RN, social work aid.  Declined skilled nursing facility at this time due to the pandemic and not been able to see her father, will observe how things are going at home before making the decision for SNF outpatient if needed.  Time Spent in minutes   40 minutes  Procedures:  Cystoscopy with bilateral ureteral stent placement 01/09/2020, Dr. Lovena Neighbours  Consultants:   Urology Nephrology  Antimicrobials:   Anti-infectives (From admission, onward)   Start     Dose/Rate Route Frequency Ordered Stop   01/11/20 0800  cefTRIAXone (ROCEPHIN) 2 g in sodium chloride 0.9 % 100 mL IVPB     2 g 200 mL/hr over 30 Minutes Intravenous Every 24 hours 01/10/20 1240 01/13/20 0924   01/09/20 1000  cefTRIAXone (ROCEPHIN) 2 g in sodium chloride 0.9 % 100 mL IVPB     2 g 200 mL/hr over 30 Minutes Intravenous Every 24 hours 01/09/20 0643  01/10/20 0847   01/08/20 1000  cefTRIAXone (ROCEPHIN) 1 g in sodium chloride 0.9 % 100 mL IVPB  Status:  Discontinued     1 g 200 mL/hr over 30 Minutes Intravenous Every 24 hours 01/08/20 0727 01/09/20 0643   01/07/20 1215  cefTRIAXone (ROCEPHIN) 1 g in sodium chloride 0.9 % 100 mL IVPB     1 g 200 mL/hr over 30 Minutes Intravenous  Once 01/07/20 1210 01/07/20 1317   01/07/20 1200  cefTRIAXone (ROCEPHIN) injection 1 g  Status:  Discontinued     1 g Intramuscular  Once 01/07/20 1153 01/07/20 1210          Medications  Scheduled Meds: . B-complex with vitamin C  1 tablet Oral Daily  . Chlorhexidine Gluconate Cloth  6 each Topical Daily  . finasteride  5 mg Oral Daily  . LORazepam  0.5-1 mg Oral BID  . Melatonin  3 mg Oral QHS  . polyethylene glycol  17 g Oral Daily  . tamsulosin  0.4 mg Oral BID  . traZODone  50 mg Oral QHS  Continuous Infusions: PRN Meds:.haloperidol lactate, traMADol      Subjective:   Todd Wilkinson was seen and examined today.  Renal function improved, confusion around 5 AM, now improving.  Still has intermittent episodes.  No headache, chest pain, palpitations, shortness of breath or abdominal pain.    Objective:   Vitals:   01/13/20 2156 01/14/20 0423 01/14/20 0925 01/14/20 1141  BP: (!) 153/70 (!) 154/73 122/87 (!) 122/50  Pulse: 68 (!) 53 74 74  Resp: 18 18  17   Temp: 98.3 F (36.8 C) 98 F (36.7 C) 98.2 F (36.8 C) 98.1 F (36.7 C)  TempSrc: Oral Oral Oral Oral  SpO2: 99% 97% 100% 96%  Weight:      Height:        Intake/Output Summary (Last 24 hours) at 01/14/2020 1227 Last data filed at 01/14/2020 [id number] Gross per 24 hour  Intake 620 ml  Output 2000 ml  Net -1380 ml     Wt Readings from Last 3 Encounters:  01/08/20 74.5 kg  12/26/19 74.5 kg     Exam  General: Alert and oriented x self and place comfortable,  Cardiovascular: S1 S2 auscultated, no murmurs, RRR  Respiratory: Clear to auscultation bilaterally, no wheezing,  rales or rhonchi  Gastrointestinal: Soft, nontender, nondistended, + bowel sounds  Ext: no pedal edema bilaterally  Neuro: No new deficits  Musculoskeletal: No digital cyanosis, clubbing  Skin: No rashes  Psych: Mildly confused   Data Reviewed:  I have personally reviewed following labs and imaging studies  Micro Results Recent Results (from the past 240 hour(s))  Urine culture     Status: None   Collection Time: 01/07/20 11:25 AM   Specimen: Urine, Random  Result Value Ref Range Status   Specimen Description   Final    URINE, RANDOM Performed at [facility], [place] [address] [place], [place] [address]    Special Requests   Final    NONE Performed at [facility], [place] [address], [place], [place] [address]    Culture   Final    NO GROWTH Performed at [facility], [place] [address], [place], [place] [address]    Report Status 01/08/2020 FINAL  Final  SARS CORONAVIRUS 2 (TAT 6-24 HRS) Nasopharyngeal Nasopharyngeal Swab     Status: None   Collection Time: 01/07/20  2:56 PM   Specimen: Nasopharyngeal Swab  Result Value Ref Range Status   SARS Coronavirus 2 NEGATIVE NEGATIVE Final    Comment: (NOTE) SARS-CoV-2 target nucleic acids are NOT DETECTED. The SARS-CoV-2 RNA is generally detectable in upper and lower respiratory specimens during the acute phase of infection. Negative results do not preclude SARS-CoV-2 infection, do not rule out co-infections with other pathogens, and should not be used as the sole basis for treatment or other patient management decisions. Negative results must be combined with clinical observations, patient history, and epidemiological information. The expected result is Negative. Fact Sheet for Patients: SugarRoll.be Fact Sheet for Healthcare Providers: https://www.woods-mathews.com/ This test is not yet approved or cleared by the [place] FDA and  has  been authorized for detection and/or diagnosis of SARS-CoV-2 by FDA under an Emergency Use Authorization (EUA). This EUA will remain  in effect (meaning this test can be used) for the duration of the COVID-19 declaration under Section 56 4(b)(1) of the Act, 21 U.S.C. section 360bbb-3(b)(1), unless the authorization is terminated or revoked sooner. Performed at [facility], [place] [address], [place], [place]  [id number]   Urine Culture     Status: None   Collection Time: 01/09/20  1:13 PM   Specimen: Urine, Random  Result Value Ref Range Status   Specimen Description   Final    URINE, RANDOM CYTOSCOPE Performed at [facility], [place] [address] [place], [place] [address]    Special Requests   Final    NONE Performed at [facility], [place] [address] [place], [place] [address]    Culture   Final    NO GROWTH Performed at [facility], [place] [address], [place], [place] [address]    Report Status 01/10/2020 FINAL  Final    Radiology Reports CT ABDOMEN PELVIS WO CONTRAST  Result Date: 01/08/2020 CLINICAL DATA:  Left lower quadrant/pelvic mass with left-sided hydronephrosis and acute renal insufficiency. EXAM: CT ABDOMEN AND PELVIS WITHOUT CONTRAST TECHNIQUE: Multidetector CT imaging of the abdomen and pelvis was performed following the standard protocol without IV contrast. COMPARISON:  Lumbar spine CT 05/27/2013 FINDINGS: Lower chest: Subtle peripheral interstitial thickening in the lung bases. No lobar consolidation or effusion. Mild cardiomegaly. Calcified plaque over the descending thoracic aorta. Hepatobiliary: Gallbladder is contracted. Liver and biliary tree are normal. Pancreas: Normal. Spleen: Normal. Adrenals/Urinary Tract: Adrenal glands are normal. Kidneys are normal in size and demonstrate mild bilateral nephrolithiasis as well as a few arterial calcifications near the renal hilum. There is mild bilateral symmetric hydronephrosis with  mild dilatation of the ureters. There is increased density over the region of the left UPJ/proximal left ureter likely hemorrhagic material. Symmetric bilateral perinephric stranding/fluid. Mild hyperdensity filling the distal right ureter which again suggest hemorrhagic material. Foley catheter is present within the bladder. There are a few foci of air within the bladder likely from recent instrumentation. There is also possible mild increased density over the anterior aspect of the bladder. Stomach/Bowel: Suggestion of small sliding hiatal hernia. Stomach is otherwise unremarkable. Small bowel is normal. Minimal diverticulosis of the colon which is otherwise unremarkable. Cecum is located within the midline upper pelvis. Appendix is normal. Vascular/Lymphatic: Moderate calcified plaque over the abdominal aorta which is normal in caliber. Prominent left periaortic lymph node measuring 2.7 cm by short axis at the level of the aortic bifurcation. No other significant adenopathy visualized. Reproductive: Mild prostatic enlargement. Other: Mild free fluid over the pericolic gutters, retroperitoneum and pelvis. Musculoskeletal: Degenerative change of the spine and hips. Posterior fusion hardware intact from L2-S1. IMPRESSION: 1. Foley catheter in adequate position over the bladder. Mild symmetric bilateral hydronephrosis. Mild bilateral nephrolithiasis. No ureteral stones present. Hyperdense material over the left UPJ and proximal ureter as well as over the distal right ureter suggesting hemorrhagic material. Mild ascites. 2. Abnormal left periaortic lymph node at the level of the aortic bifurcation measuring 2.7 cm by short axis. 3.  Mild prostatic enlargement. 4.  Aortic Atherosclerosis (ICD10-I70.0).  Mild cardiomegaly. Electronically Signed   By: Marin Olp M.D.   On: 01/08/2020 10:15   US Renal  Result Date: 01/07/2020 CLINICAL DATA:  Acute kidney injury EXAM: RENAL / URINARY TRACT ULTRASOUND COMPLETE  COMPARISON:  None. FINDINGS: Right Kidney: Renal measurements: 12.7 x 5.5 x 6.2 cm = volume: 226 mL . Echogenicity within normal limits. No mass or hydronephrosis visualized. Left Kidney: Renal measurements: 11.9 x 5.9 x 6.3 cm = volume: 231 mL. There is moderate hydronephrosis with debris in the renal pelvis. Bladder: There is a Foley catheter within the urinary bladder. Other: Hypoechoic mass in the left lower  quadrant measuring 4.3 x 2.3 x 4.3 cm, possibly a lymph node. IMPRESSION: 1. Moderate left hydronephrosis with debris in the renal pelvis. 2. 4.3 cm mass in the left lower quadrant, possibly a enlarged lymph node. 3. CT of the abdomen and pelvis is recommended for further evaluation. Electronically Signed   By: Ulyses Jarred M.D.   On: 01/07/2020 15:03   DG Abd Portable 1V  Result Date: 01/07/2020 CLINICAL DATA:  Bladder pain 1 week.  Difficulty emptying bladder. EXAM: PORTABLE ABDOMEN - 1 VIEW COMPARISON:  None. FINDINGS: Catheter present over the midline pelvis likely Foley catheter. There are several air-filled nondilated large and small bowel loops. No free peritoneal air. Degenerative change of the spine. Fusion hardware intact over the lumbosacral spine from L2 to S1. IMPRESSION: Nonspecific, nonobstructive bowel gas pattern with several air-filled nondilated large and small bowel loops. Electronically Signed   By: Marin Olp M.D.   On: 01/07/2020 16:17   DG C-Arm 1-60 Min-No Report  Result Date: 01/09/2020 Fluoroscopy was utilized by the requesting physician.  No radiographic interpretation.   US Abdomen Limited RUQ  Result Date: 01/08/2020 CLINICAL DATA:  Hyperbilirubinemia EXAM: ULTRASOUND ABDOMEN LIMITED RIGHT UPPER QUADRANT COMPARISON:  None. FINDINGS: Gallbladder: No gallstones or wall thickening visualized. No sonographic Murphy sign noted by sonographer. Common bile duct: Diameter: 3 mm Liver: No focal lesion identified. Within normal limits in parenchymal echogenicity. Portal  vein is patent on color Doppler imaging with normal direction of blood flow towards the liver. Other: Distended IVC that is not changed with respiration per report. Visualization of the right kidney specifically performed on ultrasound yesterday. IMPRESSION: 1. Negative sonography of the liver and gallbladder. 2. Distended IVC, are there symptoms of right heart failure? Electronically Signed   By: Monte Fantasia M.D.   On: 01/08/2020 08:06    Lab Data:  CBC: Recent Labs  Lab 01/08/20 0215 01/09/20 0443 01/10/20 0414 01/11/20 0416 01/14/20 0426  WBC 15.6* 11.9* 6.9 9.2 10.3  HGB 11.1* 10.0* 10.0* 10.2* 10.1*  HCT 32.2* 29.6* 29.5* 32.1* 31.1*  MCV 99.4 102.1* 102.4* 105.2* 105.1*  PLT 126* 114* 115* 154 [id number]   Basic Metabolic Panel: Recent Labs  Lab 01/10/20 0414 01/11/20 0416 01/12/20 0450 01/13/20 0335 01/14/20 0426  NA 136 141 145 147* 143  144  K 4.8 4.2 4.3 4.2 4.0  4.0  CL 110 115* 118* 119* 115*  115*  CO2 15* 19* 19* 21* 21*  22  GLUCOSE 131* 108* 112* 104* 91  92  BUN 110* 79* 54* 35* 29*  28*  CREATININE 4.81* 2.68* 1.54* 1.13 0.95  0.81  CALCIUM 8.3* 8.4* 8.2* 8.2* 8.2*  8.3*  PHOS 6.8* 3.8 2.6 2.6 2.3*   GFR: Estimated Creatinine Clearance: 49.6 mL/min (by C-G formula based on SCr of 0.95 mg/dL). Liver Function Tests: Recent Labs  Lab 01/08/20 0215 01/09/20 0443 01/10/20 0414 01/11/20 0416 01/12/20 0450 01/13/20 0335 01/14/20 0426  AST 18  --   --   --   --   --   --   ALT 22  --   --   --   --   --   --   ALKPHOS 72  --   --   --   --   --   --   BILITOT 3.3*  --   --   --   --   --   --   PROT 5.9*  --   --   --   --   --   --  ALBUMIN 2.7*  2.8*   < > 2.4* 2.5* 2.7* 2.5* 2.7*   < > = values in this interval not displayed.   No results for input(s): LIPASE, AMYLASE in the last 168 hours. No results for input(s): AMMONIA in the last 168 hours. Coagulation Profile: Recent Labs  Lab 01/08/20 0215  INR 2.5*   Cardiac Enzymes: No  results for input(s): CKTOTAL, CKMB, CKMBINDEX, TROPONINI in the last 168 hours. BNP (last 3 results) No results for input(s): PROBNP in the last 8760 hours. HbA1C: No results for input(s): HGBA1C in the last 72 hours. CBG: No results for input(s): GLUCAP in the last 168 hours. Lipid Profile: No results for input(s): CHOL, HDL, LDLCALC, TRIG, CHOLHDL, LDLDIRECT in the last 72 hours. Thyroid Function Tests: No results for input(s): TSH, T4TOTAL, FREET4, T3FREE, THYROIDAB in the last 72 hours. Anemia Panel: No results for input(s): VITAMINB12, FOLATE, FERRITIN, TIBC, IRON, RETICCTPCT in the last 72 hours. Urine analysis:    Component Value Date/Time   COLORURINE ORANGE (A) 01/07/2020 1125   APPEARANCEUR TURBID (A) 01/07/2020 1125   LABSPEC 1.020 01/07/2020 1125   PHURINE 5.0 01/07/2020 1125   GLUCOSEU NEGATIVE 01/07/2020 1125   HGBUR LARGE (A) 01/07/2020 1125   BILIRUBINUR SMALL (A) 01/07/2020 1125   KETONESUR NEGATIVE 01/07/2020 1125   PROTEINUR 100 (A) 01/07/2020 1125   NITRITE POSITIVE (A) 01/07/2020 1125   LEUKOCYTESUR SMALL (A) 01/07/2020 1125     Carigan Lister M.D. [facility] 01/14/2020, 12:27 PM   Call night coverage person covering after 7pm

## 2020-01-14 NOTE — Progress Notes (Signed)
CCMD informed RN that pt hr dropped down to 48 with a 2.5 pause. MD notified of the change in HR. Stat EKG was ordered. Pt will not be discharged today and cardiology will follow pt. Will continue to monitor pt.

## 2020-01-14 NOTE — TOC Progression Note (Signed)
Transition of Care [facility]) - Progression Note    Patient Details  Name: Todd Wilkinson MRN: [id number] Date of Birth: [birth year]  Transition of [facility] CM/SW Contact  Joaquin Courts, RN Phone Number: 01/14/2020, 9:39 AM  Clinical Narrative:   CM reached out to patient by phone and spoke with him with the assistance of the student nurse present at bedside. Patient set up with [facility] for HHPT/OT/RN/aide/social work. CM explained that DME tub bench order was placed and explained to patient what this piece of equipment does.  Patient reports he has a shower that he steps into with the help of gram bars that are installed in th shower and then he sits to bathe.  Patient is reluctant to get a tub bench.  CM explained the benefits of a tub bench with getting into the shower and then having a raised place to sit while bathing.  Of note, [employer] rep, Thedore Mins, did inform CM that patient's insurance does not cover the cost of a tub bench and it would be an out of pocket cost of 60$, this was explained to the patient.  Patient declines tub bench at this time.  CM informed patient that should he change his mind, equipment can be obtained from medical supply store.      Expected Discharge Plan: [place] Barriers to Discharge: Continued Medical Work up  Expected Discharge Plan and Services Expected Discharge Plan: [place]   Discharge Planning Services: CM Consult   Living arrangements for the past 2 months: Single Family Home Expected Discharge Date: 01/14/20               DME Arranged: N/A DME Agency: NA       HH Arranged: PT, OT, Nurse's Aide, Social Work, Therapist, sports [place] Agency: [place] Date [place]: 01/14/20 Time [place]: [phone] Representative spoke with at [place]: [place] ([place]) Interventions    Readmission Risk Interventions No flowsheet data found.

## 2020-01-15 LAB — RENAL FUNCTION PANEL
Albumin: 2.8 g/dL — ABNORMAL LOW (ref 3.5–5.0)
Anion gap: 6 (ref 5–15)
BUN: 25 mg/dL — ABNORMAL HIGH (ref 8–23)
CO2: 23 mmol/L (ref 22–32)
Calcium: 8.3 mg/dL — ABNORMAL LOW (ref 8.9–10.3)
Chloride: 115 mmol/L — ABNORMAL HIGH (ref 98–111)
Creatinine, Ser: 0.93 mg/dL (ref 0.61–1.24)
GFR calc Af Amer: >60 mL/min (ref >60)
GFR calc non Af Amer: >60 mL/min (ref >60)
Glucose, Bld: 83 mg/dL (ref 70–99)
Phosphorus: 2.5 mg/dL (ref 2.5–4.6)
Potassium: 3.9 mmol/L (ref 3.5–5.1)
Sodium: 144 mmol/L (ref 135–145)

## 2020-01-15 MED ORDER — RESOURCE THICKENUP CLEAR PO POWD: ORAL | 2 refills | Status: DC

## 2020-01-15 NOTE — Discharge Summary (Signed)
Physician Discharge Summary   Patient ID: Todd Wilkinson MRN: [id number] DOB/AGE: Feb 23, 1925 86 y.o.  Admit date: 01/07/2020 Discharge date: 01/15/2020  Primary Care Physician:  Deland Pretty, MD   Recommendations for Outpatient Follow-up:  1. Follow up with PCP in 1-2 weeks 2. Foley needed to be placed back due to obstructive uropathy, voiding trial at urology office, on 01/23/2020 3. Patient recommended to hold losartan for at least 3 to 4 weeks  4. Hold Xarelto until advised to resume by urology or cardiology 5. Please follow left lower quadrant abdominal mass felt to be secondary to periaortic lymph node on the imaging  Home Health: Home health PT OT, RN, home health aide, social work Equipment/Devices:   Discharge Condition: stable  CODE STATUS: DNR Diet recommendation: Dysphagia 3 diet with nectar thick liquids   Discharge Diagnoses:    . AKI (acute kidney injury) ([place]) Obstructive uropathy with bilateral hydronephrosis Status post cystoscopy with bilateral ureteral stent placement on 2/22 Urinary retention Bradycardia with history of A. fib, slow ventricular response with 2-second pause . Permanent A. fib ([place]) . Essential hypertension . Hyperlipidemia . BPH (benign prostatic hyperplasia) . Bilateral pyelonephritis, UTI . Abdominal mass, LLQ (left lower quadrant)   Consults:  Urology, Dr. Lovena Neighbours Nephrology Cardiology, Dr. Einar Gip  Allergies:  No Known Allergies   DISCHARGE MEDICATIONS: Allergies as of 01/15/2020   No Known Allergies     Medication List    STOP taking these medications   ciprofloxacin 500 MG tablet Commonly known as: CIPRO   losartan 50 MG tablet Commonly known as: COZAAR     TAKE these medications   b complex vitamins capsule Take 1 capsule by mouth daily.   finasteride 5 MG tablet Commonly known as: PROSCAR Take 1 tablet (5 mg total) by mouth daily.   LORazepam 0.5 MG tablet Commonly known as: ATIVAN Take 0.5-1 mg by mouth  2 (two) times daily.   LORazepam 1 MG tablet Commonly known as: ATIVAN Take 1 mg by mouth every 8 (eight) hours as needed for anxiety.   omeprazole 20 MG capsule Commonly known as: PRILOSEC Take 20 mg by mouth 2 (two) times daily as needed.   pravastatin 80 MG tablet Commonly known as: PRAVACHOL Take 1 tablet by mouth daily.   Resource ThickenUp Clear Powd NECTAR THICK LIQUID with meals   Rivaroxaban 15 MG Tabs tablet Commonly known as: XARELTO Take 1 tablet (15 mg total) by mouth daily with supper. Please HOLD until advised by PCP/urology to restart What changed: additional instructions   tamsulosin 0.4 MG Caps capsule Commonly known as: FLOMAX Take 1 capsule (0.4 mg total) by mouth 2 (two) times daily. What changed: when to take this            Durable Medical Equipment  (From admission, onward)         Start     Ordered   01/12/20 0712  For home use only DME Tub bench  Once     01/12/20 0711           Brief H and P: For complete details please refer to admission H and P, but in brief [name] a 86 year old Caucasian male with past medical history remarkable for subtle atrial fibrillation, HLD, essential hypertension, chronic constipation, BPH who presented to the ED complaining of bladder discomfort x1 week. He also states feels that he has trouble emptying his bladder. Recently seen at urgent care 2 weeks ago, diagnosed with UTI and started on  ciprofloxacin; and despite treatment, symptoms of hesitancy and dysuria continued. He completed 7/14 days of ciprofloxacin and was taking Azo to help with symptoms; with change in his urine color to orange  Renal ultrasound showed moderate left hydronephrosis, 4.3 cm mass left lower quadrant described as possible enlarged lymph node.  Patient was placed on IV ceftriaxone and was referred for admission for AKI, left hydronephrosis, left lower quadrant mass, UTI   Hospital Course:   Bradycardia with a  2.1-second pause, history of paroxysmal A. fib with slow ventricular response - on 2/27, noted to have heart rate down to 38, with 2.1-second pause improved to 40s. - No dizziness lightheadedness or syncopal episode.  EKG showed A. fib with slow ventricular response.  Patient is not on any rate controlling medications.   --Xarelto was held, with renal function decline.  Heparin drip discontinued on 2/21, likely due to hemorrhagic material noted on CT abdomen pelvis -Cardiology was consulted, per Dr. Einar Gip who reviewed the telemetry rhythm strips, EKG, patient had at most 2.1-second ventricular standstill however patient is in permanent A. fib.  Totally asymptomatic without any syncopal episodes in the past, no need of any pacemaker.    Acute kidney injury, obstructive uropathy with bilateral hydronephrosis -Patient presented with bladder pain, decreased urine output, creatinine elevated to 5.8, BUN 112.  Baseline 0.98 in October 2019.  History of BPH on Flomax. -Bladder scan in the ED > 999 cc retained urine.  Foley catheter was placed.  -Renal ultrasound showed moderate left hydronephrosis with debris's in renal pelvis.  CT abdomen pelvis with mild symmetric hydronephrosis, no stones, hyperdense material in left UPJ, proximal ureter, distal right ureter suggestive of hemorrhagic material and bilateral perinephric stranding -Nephrology and urology consulted, status post cystoscopy with bilateral ureteral stent placement on 2/22. -Creatinine improved--creatinine on admission 5.8, plateaued at 7.06, has been trending down, 0.95 today  -Patient was placed on IV hydration.  Continue to hold losartan, nephrology recommended holding for 3 to 4 weeks post discharge. -Voiding trial was done on 2/26, patient failed the trial, Foley catheter was reinserted.  Patient has an appointment with urology on 01/23/2020 with Dr. Gilford Rile.  Recommended to keep Foley in until the urology appointment   UTI, pyelonephritis,  bilateral -Patient was on ciprofloxacin completed 7/14 days of outpatient treatment.  Urine culture showed no growth -CT abdomen pelvis notable for bilateral perinephric stranding consistent with pyelonephritis. -Urine culture 2/22 during cystoscopy showed no growth however patient was on antibiotics for 7 days PTA -He has completed 5-day course of IV Rocephin.  Left lower quadrant abdominal mass secondary to periaortic lymph node Renal ultrasound with 4.3 cm mass left lower quadrant questionable enlarged lymph node.  CT abdomen pelvis without contrast notable for 2.7 cm left periaortic lymph node.  Hyperlipidemia --Resume statin  Essential hypertension -BP stable, holding home losartan secondary to AKI.  Nephrology recommended holding 3 to 4 weeks following discharge.   BPH -PSA total of 20 in 2019.  Repeat PSA elevated to 72 -Increase tamsulosin 2.4 mg twice daily and started on finasteride -Urology following and has outpatient appointment   Sundowning --Delirium precautions, lights on during the day, off at night   Day of Discharge S: No acute issue overnight.  Alert and oriented, hoping to go home today.  No profound bradycardia, heart rate between 70 to 80s  BP 123/66 (BP Location: Right Arm)   Pulse 85   Temp 98 F (36.7 C) (Oral)   Resp 18   Ht  5\' 9"  (1.753 m) Comment: 1 week ago documentation  Wt 74.5 kg Comment: per one week ago documentation  SpO2 97%   BMI 24.26 kg/m   Physical Exam: General: Alert and awake oriented x3 not in any acute distress. HEENT: anicteric sclera, pupils reactive to light and accommodation CVS: Irregularly irregular Chest: clear to auscultation bilaterally, no wheezing rales or rhonchi Abdomen: soft nontender, nondistended, normal bowel sounds Extremities: no cyanosis, clubbing or edema noted bilaterally Neuro: Cranial nerves II-XII intact, no focal neurological deficits    Get Medicines reviewed and adjusted: Please take  all your medications with you for your next visit with your Primary MD  Please request your Primary MD to go over all hospital tests and procedure/radiological results at the follow up. Please ask your Primary MD to get all Hospital records sent to his/her office.  If you experience worsening of your admission symptoms, develop shortness of breath, life threatening emergency, suicidal or homicidal thoughts you must seek medical attention immediately by calling 911 or calling your MD immediately  if symptoms less severe.  You must read complete instructions/literature along with all the possible adverse reactions/side effects for all the Medicines you take and that have been prescribed to you. Take any new Medicines after you have completely understood and accept all the possible adverse reactions/side effects.   Do not drive when taking pain medications.   Do not take more than prescribed Pain, Sleep and Anxiety Medications  Special Instructions: If you have smoked or chewed Tobacco  in the last 2 yrs please stop smoking, stop any regular Alcohol  and or any Recreational drug use.  Wear Seat belts while driving.  Please note  You were cared for by a hospitalist during your hospital stay. Once you are discharged, your primary care physician will handle any further medical issues. Please note that NO REFILLS for any discharge medications will be authorized once you are discharged, as it is imperative that you return to your primary care physician (or establish a relationship with a primary care physician if you do not have one) for your aftercare needs so that they can reassess your need for medications and monitor your lab values.   The results of significant diagnostics from this hospitalization (including imaging, microbiology, ancillary and laboratory) are listed below for reference.      Procedures/Studies:  CT ABDOMEN PELVIS WO CONTRAST  Result Date: 01/08/2020 CLINICAL DATA:  Left  lower quadrant/pelvic mass with left-sided hydronephrosis and acute renal insufficiency. EXAM: CT ABDOMEN AND PELVIS WITHOUT CONTRAST TECHNIQUE: Multidetector CT imaging of the abdomen and pelvis was performed following the standard protocol without IV contrast. COMPARISON:  Lumbar spine CT 05/27/2013 FINDINGS: Lower chest: Subtle peripheral interstitial thickening in the lung bases. No lobar consolidation or effusion. Mild cardiomegaly. Calcified plaque over the descending thoracic aorta. Hepatobiliary: Gallbladder is contracted. Liver and biliary tree are normal. Pancreas: Normal. Spleen: Normal. Adrenals/Urinary Tract: Adrenal glands are normal. Kidneys are normal in size and demonstrate mild bilateral nephrolithiasis as well as a few arterial calcifications near the renal hilum. There is mild bilateral symmetric hydronephrosis with mild dilatation of the ureters. There is increased density over the region of the left UPJ/proximal left ureter likely hemorrhagic material. Symmetric bilateral perinephric stranding/fluid. Mild hyperdensity filling the distal right ureter which again suggest hemorrhagic material. Foley catheter is present within the bladder. There are a few foci of air within the bladder likely from recent instrumentation. There is also possible mild increased density over the anterior  aspect of the bladder. Stomach/Bowel: Suggestion of small sliding hiatal hernia. Stomach is otherwise unremarkable. Small bowel is normal. Minimal diverticulosis of the colon which is otherwise unremarkable. Cecum is located within the midline upper pelvis. Appendix is normal. Vascular/Lymphatic: Moderate calcified plaque over the abdominal aorta which is normal in caliber. Prominent left periaortic lymph node measuring 2.7 cm by short axis at the level of the aortic bifurcation. No other significant adenopathy visualized. Reproductive: Mild prostatic enlargement. Other: Mild free fluid over the pericolic gutters,  retroperitoneum and pelvis. Musculoskeletal: Degenerative change of the spine and hips. Posterior fusion hardware intact from L2-S1. IMPRESSION: 1. Foley catheter in adequate position over the bladder. Mild symmetric bilateral hydronephrosis. Mild bilateral nephrolithiasis. No ureteral stones present. Hyperdense material over the left UPJ and proximal ureter as well as over the distal right ureter suggesting hemorrhagic material. Mild ascites. 2. Abnormal left periaortic lymph node at the level of the aortic bifurcation measuring 2.7 cm by short axis. 3.  Mild prostatic enlargement. 4.  Aortic Atherosclerosis (ICD10-I70.0).  Mild cardiomegaly. Electronically Signed   By: Marin Olp M.D.   On: 01/08/2020 10:15   US Renal  Result Date: 01/07/2020 CLINICAL DATA:  Acute kidney injury EXAM: RENAL / URINARY TRACT ULTRASOUND COMPLETE COMPARISON:  None. FINDINGS: Right Kidney: Renal measurements: 12.7 x 5.5 x 6.2 cm = volume: 226 mL . Echogenicity within normal limits. No mass or hydronephrosis visualized. Left Kidney: Renal measurements: 11.9 x 5.9 x 6.3 cm = volume: 231 mL. There is moderate hydronephrosis with debris in the renal pelvis. Bladder: There is a Foley catheter within the urinary bladder. Other: Hypoechoic mass in the left lower quadrant measuring 4.3 x 2.3 x 4.3 cm, possibly a lymph node. IMPRESSION: 1. Moderate left hydronephrosis with debris in the renal pelvis. 2. 4.3 cm mass in the left lower quadrant, possibly a enlarged lymph node. 3. CT of the abdomen and pelvis is recommended for further evaluation. Electronically Signed   By: Ulyses Jarred M.D.   On: 01/07/2020 15:03   DG Abd Portable 1V  Result Date: 01/07/2020 CLINICAL DATA:  Bladder pain 1 week.  Difficulty emptying bladder. EXAM: PORTABLE ABDOMEN - 1 VIEW COMPARISON:  None. FINDINGS: Catheter present over the midline pelvis likely Foley catheter. There are several air-filled nondilated large and small bowel loops. No free peritoneal  air. Degenerative change of the spine. Fusion hardware intact over the lumbosacral spine from L2 to S1. IMPRESSION: Nonspecific, nonobstructive bowel gas pattern with several air-filled nondilated large and small bowel loops. Electronically Signed   By: Marin Olp M.D.   On: 01/07/2020 16:17   DG C-Arm 1-60 Min-No Report  Result Date: 01/09/2020 Fluoroscopy was utilized by the requesting physician.  No radiographic interpretation.   US Abdomen Limited RUQ  Result Date: 01/08/2020 CLINICAL DATA:  Hyperbilirubinemia EXAM: ULTRASOUND ABDOMEN LIMITED RIGHT UPPER QUADRANT COMPARISON:  None. FINDINGS: Gallbladder: No gallstones or wall thickening visualized. No sonographic Murphy sign noted by sonographer. Common bile duct: Diameter: 3 mm Liver: No focal lesion identified. Within normal limits in parenchymal echogenicity. Portal vein is patent on color Doppler imaging with normal direction of blood flow towards the liver. Other: Distended IVC that is not changed with respiration per report. Visualization of the right kidney specifically performed on ultrasound yesterday. IMPRESSION: 1. Negative sonography of the liver and gallbladder. 2. Distended IVC, are there symptoms of right heart failure? Electronically Signed   By: Monte Fantasia M.D.   On: 01/08/2020 08:06  LAB RESULTS: Basic Metabolic Panel: Recent Labs  Lab 01/14/20 0426 01/14/20 0426 01/15/20 0543  NA 143  144  --  144  K 4.0  4.0  --  3.9  CL 115*  115*  --  115*  CO2 21*  22  --  23  GLUCOSE 91  92  --  83  BUN 29*  28*  --  25*  CREATININE 0.95  0.81  --  0.93  CALCIUM 8.2*  8.3*  --  8.3*  PHOS 2.3*   < > 2.5   < > = values in this interval not displayed.   Liver Function Tests: Recent Labs  Lab 01/14/20 0426 01/15/20 0543  ALBUMIN 2.7* 2.8*   No results for input(s): LIPASE, AMYLASE in the last 168 hours. No results for input(s): AMMONIA in the last 168 hours. CBC: Recent Labs  Lab 01/11/20 0416  01/11/20 0416 01/14/20 0426  WBC 9.2  --  10.3  HGB 10.2*  --  10.1*  HCT 32.1*  --  31.1*  MCV 105.2*   < > 105.1*  PLT 154  --  159   < > = values in this interval not displayed.   Cardiac Enzymes: No results for input(s): CKTOTAL, CKMB, CKMBINDEX, TROPONINI in the last 168 hours. BNP: Invalid input(s): POCBNP CBG: No results for input(s): GLUCAP in the last 168 hours.     Disposition and Follow-up: Discharge Instructions    Discharge instructions   Complete by: As directed    Diet: SOFT diet with nectar thick liquids.  Hold xarelto until advised to restart by Dr Einar Gip or urology STOP losartan for atleast 3-4 weeks, please follow-up with primary physician/ cardiology when to resart Keep Foley cath in, Voiding trial in UROLOGY OFFICE on the appt       DISPOSITION: Home, patient's family declined skilled nursing facility   [place], Christopher Aaron, MD Follow up on 01/23/2020.   Specialty: Urology Why: My office will call to schedule your follow-up appointment with me in 10 to 14 days.  Please keep Foley in until voiding trial in the office. Contact information: [address] [place] [address] [phone]        Deland Pretty, MD. Schedule an appointment as soon as possible for a visit in 10 day(s).   Specialty: Internal Medicine Contact information: [address] [place] [address] [phone]        [facility], [facility] Follow up.   Specialty: Home Health Services Why: agency will provide home health physical therapy, occupational therapy, nurse, aide, and social work. Contact information: [place] [address] [phone]        Adrian Prows, MD. Schedule an appointment as soon as possible for a visit in 1 week(s).   Specialty: Cardiology Contact information: [place] [address] [phone]             Time coordinating discharge:  40 minutes  Signed:   Estill Cotta M.D. [facility] 01/15/2020, 11:44 AM

## 2020-01-15 NOTE — Progress Notes (Signed)
New order received. Pt already on caseload, will continue to follow until discharged.  [place], OTR/L Acute Rehab Services Pager [phone] Office [phone]

## 2020-01-15 NOTE — Progress Notes (Signed)
Pt discharge instructions reviewed with daughters at bedside. Foley teaching with teach back were given to the daughters. Foley supplies sent with pt. No questions or concerns at this time.

## 2020-01-15 NOTE — Plan of Care (Signed)

## 2020-01-17 ENCOUNTER — Other Ambulatory Visit: Payer: Medicare Other

## 2020-01-17 DIAGNOSIS — N132 Hydronephrosis with renal and ureteral calculous obstruction: Secondary | ICD-10-CM | POA: Diagnosis not present

## 2020-01-17 DIAGNOSIS — R338 Other retention of urine: Secondary | ICD-10-CM | POA: Diagnosis not present

## 2020-01-17 DIAGNOSIS — I119 Hypertensive heart disease without heart failure: Secondary | ICD-10-CM | POA: Diagnosis not present

## 2020-01-17 DIAGNOSIS — Z466 Encounter for fitting and adjustment of urinary device: Secondary | ICD-10-CM | POA: Diagnosis not present

## 2020-01-18 DIAGNOSIS — R338 Other retention of urine: Secondary | ICD-10-CM | POA: Diagnosis not present

## 2020-01-18 DIAGNOSIS — I119 Hypertensive heart disease without heart failure: Secondary | ICD-10-CM | POA: Diagnosis not present

## 2020-01-18 DIAGNOSIS — Z466 Encounter for fitting and adjustment of urinary device: Secondary | ICD-10-CM | POA: Diagnosis not present

## 2020-01-18 DIAGNOSIS — N132 Hydronephrosis with renal and ureteral calculous obstruction: Secondary | ICD-10-CM | POA: Diagnosis not present

## 2020-01-19 DIAGNOSIS — N132 Hydronephrosis with renal and ureteral calculous obstruction: Secondary | ICD-10-CM | POA: Diagnosis not present

## 2020-01-19 DIAGNOSIS — Z466 Encounter for fitting and adjustment of urinary device: Secondary | ICD-10-CM | POA: Diagnosis not present

## 2020-01-19 DIAGNOSIS — I119 Hypertensive heart disease without heart failure: Secondary | ICD-10-CM | POA: Diagnosis not present

## 2020-01-19 DIAGNOSIS — R338 Other retention of urine: Secondary | ICD-10-CM | POA: Diagnosis not present

## 2020-01-23 DIAGNOSIS — N130 Hydronephrosis with ureteropelvic junction obstruction: Secondary | ICD-10-CM | POA: Diagnosis not present

## 2020-01-23 DIAGNOSIS — R338 Other retention of urine: Secondary | ICD-10-CM | POA: Diagnosis not present

## 2020-01-23 DIAGNOSIS — N200 Calculus of kidney: Secondary | ICD-10-CM | POA: Diagnosis not present

## 2020-01-24 DIAGNOSIS — Z466 Encounter for fitting and adjustment of urinary device: Secondary | ICD-10-CM | POA: Diagnosis not present

## 2020-01-24 DIAGNOSIS — N132 Hydronephrosis with renal and ureteral calculous obstruction: Secondary | ICD-10-CM | POA: Diagnosis not present

## 2020-01-24 DIAGNOSIS — I119 Hypertensive heart disease without heart failure: Secondary | ICD-10-CM | POA: Diagnosis not present

## 2020-01-24 DIAGNOSIS — R338 Other retention of urine: Secondary | ICD-10-CM | POA: Diagnosis not present

## 2020-01-25 DIAGNOSIS — E876 Hypokalemia: Secondary | ICD-10-CM | POA: Diagnosis not present

## 2020-01-25 DIAGNOSIS — R600 Localized edema: Secondary | ICD-10-CM | POA: Diagnosis not present

## 2020-01-25 DIAGNOSIS — L97921 Non-pressure chronic ulcer of unspecified part of left lower leg limited to breakdown of skin: Secondary | ICD-10-CM | POA: Diagnosis not present

## 2020-01-26 DIAGNOSIS — R338 Other retention of urine: Secondary | ICD-10-CM | POA: Diagnosis not present

## 2020-01-26 DIAGNOSIS — Z466 Encounter for fitting and adjustment of urinary device: Secondary | ICD-10-CM | POA: Diagnosis not present

## 2020-01-26 DIAGNOSIS — I119 Hypertensive heart disease without heart failure: Secondary | ICD-10-CM | POA: Diagnosis not present

## 2020-01-26 DIAGNOSIS — N132 Hydronephrosis with renal and ureteral calculous obstruction: Secondary | ICD-10-CM | POA: Diagnosis not present

## 2020-01-27 DIAGNOSIS — N132 Hydronephrosis with renal and ureteral calculous obstruction: Secondary | ICD-10-CM | POA: Diagnosis not present

## 2020-01-27 DIAGNOSIS — Z466 Encounter for fitting and adjustment of urinary device: Secondary | ICD-10-CM | POA: Diagnosis not present

## 2020-01-27 DIAGNOSIS — I119 Hypertensive heart disease without heart failure: Secondary | ICD-10-CM | POA: Diagnosis not present

## 2020-01-27 DIAGNOSIS — R338 Other retention of urine: Secondary | ICD-10-CM | POA: Diagnosis not present

## 2020-01-30 ENCOUNTER — Other Ambulatory Visit: Payer: Self-pay

## 2020-01-30 DIAGNOSIS — I119 Hypertensive heart disease without heart failure: Secondary | ICD-10-CM | POA: Diagnosis not present

## 2020-01-30 DIAGNOSIS — N132 Hydronephrosis with renal and ureteral calculous obstruction: Secondary | ICD-10-CM | POA: Diagnosis not present

## 2020-01-30 DIAGNOSIS — Z466 Encounter for fitting and adjustment of urinary device: Secondary | ICD-10-CM | POA: Diagnosis not present

## 2020-01-30 DIAGNOSIS — R338 Other retention of urine: Secondary | ICD-10-CM | POA: Diagnosis not present

## 2020-01-30 NOTE — Patient Outreach (Signed)
[facility]) Care Management  01/30/2020  JRUE ORMES January 19, 1925 [id number]   Medication Adherence call to Mr. Karlyn Agee Hippa Identifiers Verify spoke with patient he is past due on Losartan 50 mg,patient explain he takes 1 tablet daily,patient said he has a care giver that set up his medication and takes care of all his medications ,patient said he has medication at this time. Mr. Corner is showing past due under [place].  [place] Management Direct Dial [phone]  Fax [phone] [email or web address]

## 2020-01-31 DIAGNOSIS — R591 Generalized enlarged lymph nodes: Secondary | ICD-10-CM | POA: Diagnosis not present

## 2020-01-31 DIAGNOSIS — Z466 Encounter for fitting and adjustment of urinary device: Secondary | ICD-10-CM | POA: Diagnosis not present

## 2020-01-31 DIAGNOSIS — N132 Hydronephrosis with renal and ureteral calculous obstruction: Secondary | ICD-10-CM | POA: Diagnosis not present

## 2020-01-31 DIAGNOSIS — I119 Hypertensive heart disease without heart failure: Secondary | ICD-10-CM | POA: Diagnosis not present

## 2020-01-31 DIAGNOSIS — N179 Acute kidney failure, unspecified: Secondary | ICD-10-CM | POA: Diagnosis not present

## 2020-01-31 DIAGNOSIS — I480 Paroxysmal atrial fibrillation: Secondary | ICD-10-CM | POA: Diagnosis not present

## 2020-01-31 DIAGNOSIS — R338 Other retention of urine: Secondary | ICD-10-CM | POA: Diagnosis not present

## 2020-02-02 ENCOUNTER — Encounter: Payer: Self-pay | Admitting: Cardiology

## 2020-02-02 ENCOUNTER — Other Ambulatory Visit: Payer: Self-pay

## 2020-02-02 ENCOUNTER — Ambulatory Visit: Payer: Medicare Other | Admitting: Cardiology

## 2020-02-02 VITALS — BP 132/71 | HR 67 | Temp 95.8°F | Resp 16 | Ht 69.0 in | Wt 167.6 lb

## 2020-02-02 DIAGNOSIS — R600 Localized edema: Secondary | ICD-10-CM

## 2020-02-02 DIAGNOSIS — I10 Essential (primary) hypertension: Secondary | ICD-10-CM | POA: Diagnosis not present

## 2020-02-02 DIAGNOSIS — N1831 Chronic kidney disease, stage 3a: Secondary | ICD-10-CM | POA: Diagnosis not present

## 2020-02-02 DIAGNOSIS — I4821 Permanent atrial fibrillation: Secondary | ICD-10-CM

## 2020-02-02 DIAGNOSIS — I482 Chronic atrial fibrillation, unspecified: Secondary | ICD-10-CM | POA: Diagnosis not present

## 2020-02-02 NOTE — Progress Notes (Signed)
Primary Physician/Referring:  Deland Pretty, MD  Patient ID: Todd Wilkinson, male    DOB: [birth year], 86 y.o.   MRN: [id number]  Chief Complaint  Patient presents with  . Hospitalization Follow-up    7-10 day   HPI:    Todd Wilkinson  is a 86 y.o. Caucasian male with mild hyperlipidemia, chronic atrial fibrillation diagnosed in Feb 2021 but otherwise no significant prior cardiovascular history, no history of fall, no stroke in the past, no history of hypertension or diabetes mellitus.   Admitted to the hospital on 01/07/2020 and discharged 8 days later with acute kidney injury related to obstructive uropathy with bilateral hydronephrosis, from renal stones, UTI and enlarged prostate. Had ureteral stones removed and stent placed which has now been removed from both ureters. Anticoagulation was held and he now presents for follow-up.  He has not had any further bleeding, he is presently has a Foley catheter in situ, urine output has been clear.  No abdominal pain, no chest pain or dyspnea.  His daughter is present.  Past Medical History:  Diagnosis Date  . Hyperlipidemia    Past Surgical History:  Procedure Laterality Date  . BACK SURGERY    . CYSTOSCOPY WITH STENT PLACEMENT Bilateral 01/09/2020   Procedure: CYSTOSCOPY WITH RETROGRADE PYELOGRAM AND STENT PLACEMENT;  Surgeon: Ceasar Mons, MD;  Location: [facility];  Service: Urology;  Laterality: Bilateral;   Social History   Tobacco Use  . Smoking status: Former Smoker    Quit date: 11/17/1946    Years since quitting: 73.2  . Smokeless tobacco: Never Used  Substance Use Topics  . Alcohol use: Not Currently    ROS  Review of Systems  Cardiovascular: Positive for claudication (bilateral calf) and leg swelling (left leg). Negative for dyspnea on exertion and near-syncope.  Hematologic/Lymphatic: Does not bruise/bleed easily.  Gastrointestinal: Negative for melena.   Objective  Blood pressure 132/71, pulse 67,  temperature (!) 95.8 F (35.4 C), temperature source Temporal, resp. rate 16, height '5\' 9"'$  (1.753 m), weight 167 lb 9.6 oz (76 kg), SpO2 95 %.  Vitals with BMI 02/02/2020 01/15/2020 01/14/2020  Height '5\' 9"'$  - -  Weight 167 lbs 10 oz - -  BMI 37.85 - -  Systolic 885 027 741  Diastolic 71 66 82  Pulse 67 85 55     Physical Exam  Constitutional: He appears well-developed and well-nourished.  Cardiovascular: Normal rate, regular rhythm, S1 normal and S2 normal. Exam reveals no gallop.  Murmur heard. High-pitched blowing midsystolic murmur is present with a grade of 2/6 at the apex radiating to the axilla. Pulses:      Carotid pulses are 2+ on the right side and 2+ on the left side.      Femoral pulses are 2+ on the right side and 2+ on the left side.      Dorsalis pedis pulses are 1+ on the right side and 1+ on the left side.       Posterior tibial pulses are 0 on the right side and 0 on the left side.  2+ left leg edema. No tenderness. No JVD.   Pulmonary/Chest: Effort normal and breath sounds normal.  Abdominal: Soft. Bowel sounds are normal.   Laboratory examination:   Recent Labs    01/13/20 0335 01/14/20 0426 01/15/20 0543  NA 147* 143  144 144  K 4.2 4.0  4.0 3.9  CL 119* 115*  115* 115*  CO2 21* 21*  22 23  GLUCOSE 104* 91  92 83  BUN 35* 29*  28* 25*  CREATININE 1.13 0.95  0.81 0.93  CALCIUM 8.2* 8.2*  8.3* 8.3*  GFRNONAA 56* >60  >60 >60  GFRAA >60 >60  >60 >60   estimated creatinine clearance is 50.7 mL/min (by C-G formula based on SCr of 0.93 mg/dL).  CMP Latest Ref Rng & Units 01/15/2020 01/14/2020 01/14/2020  Glucose 70 - 99 mg/dL 83 91 92  BUN 8 - 23 mg/dL 25(H) 29(H) 28(H)  Creatinine 0.61 - 1.24 mg/dL 0.93 0.95 0.81  Sodium 135 - 145 mmol/L 144 143 144  Potassium 3.5 - 5.1 mmol/L 3.9 4.0 4.0  Chloride 98 - 111 mmol/L 115(H) 115(H) 115(H)  CO2 22 - 32 mmol/L 23 21(L) 22  Calcium 8.9 - 10.3 mg/dL 8.3(L) 8.2(L) 8.3(L)  Total Protein 6.5 - 8.1 g/dL - -  -  Total Bilirubin 0.3 - 1.2 mg/dL - - -  Alkaline Phos 38 - 126 U/L - - -  AST 15 - 41 U/L - - -  ALT 0 - 44 U/L - - -   CBC Latest Ref Rng & Units 01/14/2020 01/11/2020 01/10/2020  WBC 4.0 - 10.5 K/uL 10.3 9.2 6.9  Hemoglobin 13.0 - 17.0 g/dL 10.1(L) 10.2(L) 10.0(L)  Hematocrit 39.0 - 52.0 % 31.1(L) 32.1(L) 29.5(L)  Platelets 150 - 400 K/uL 159 154 115(L)   Lipid Panel  No results found for: CHOL, TRIG, HDL, CHOLHDL, VLDL, LDLCALC, LDLDIRECT HEMOGLOBIN A1C No results found for: HGBA1C, MPG TSH No results for input(s): TSH in the last 8760 hours.  External labs :  Labs 10/18/2019: Serum glucose 90 mg, BUN 14, creatinine 1.20, EGFR 52 mL, potassium 4.2, sodium 140.  CMP normal.  Cholesterol 103, triglycerides 61, HDL 45, LDL 44.  Hb 11.8/HCT 35.0, platelets 129, normal indicis.  Medications and allergies  No Known Allergies   Current Outpatient Medications  Medication Instructions  . b complex vitamins capsule 1 capsule, Oral, Daily  . finasteride (PROSCAR) 5 mg, Oral, Daily  . LORazepam (ATIVAN) 0.5-1 mg, Oral, 2 times daily  . omeprazole (PRILOSEC) 20 mg, Oral, 2 times daily PRN  . Rivaroxaban (XARELTO) 15 mg, Oral, Daily with supper, Please HOLD until advised by PCP/urology to restart  . spironolactone (ALDACTONE) 25 mg, Oral, Daily, Patient daughter is confirming the strength.   . tamsulosin (FLOMAX) 0.4 mg, Oral, 2 times daily   Radiology:   CT Abdomen 01/08/2020: 1. Foley catheter in adequate position over the bladder. Mild symmetric bilateral hydronephrosis. Mild bilateral nephrolithiasis. No ureteral stones present. Hyperdense material over the left UPJ and proximal ureter as well as over the distal right ureter suggesting hemorrhagic material. Mild ascites. 2. Abnormal left periaortic lymph node at the level of the aortic bifurcation measuring 2.7 cm by short axis. 3.  Mild prostatic enlargement. 4.  Aortic Atherosclerosis (ICD10-I70.0).  Mild  cardiomegaly.  Cardiac Studies:   EKG 12/26/2019: Atrial fibrillation with controlled ventricular response at rate of 48 bpm, left axis deviation, left anterior fascicular block.  Right bundle branch block.  Nonspecific T abnormality.  No significant change compared to 10/31/2019.    Assessment     ICD-10-CM   1. Permanent atrial fibrillation ([place]). CHA2DS2-VASc Score is 4.  Yearly risk of stroke: 4%(A, HTN, PAD).  I48.21   2. Stage 3a chronic kidney disease  N18.31   3. Systolic hypertension  Z61   4. Leg edema, left  R60.0 VAS [place] LOWER EXTREMITY VENOUS (DVT)    No  orders of the defined types were placed in this encounter.   Medications Discontinued During This Encounter  Medication Reason  . LORazepam (ATIVAN) 1 MG tablet Change in therapy  . Maltodextrin-Xanthan Gum (RESOURCE THICKENUP CLEAR) POWD Stop Taking at Discharge  . pravastatin (PRAVACHOL) 80 MG tablet Patient Preference    Recommendations:   Todd Wilkinson  is a 86 y.o. Caucasian male with mild hyperlipidemia, chronic atrial fibrillation diagnosed in 12/26/2019 but otherwise no significant prior cardiovascular history, no history of fall, no stroke in the past, no history of hypertension or diabetes mellitus.  I also took care of his wife, Ms. Todd Wilkinson who passed away recently.    Admitted to the hospital on 01/07/2020 and discharged 8 days later with acute kidney injury related to obstructive uropathy with bilateral hydronephrosis, from renal stones, UTI and enlarged prostate. Had ureteral stones removed and stent placed which has now been removed from both ureters. Anticoagulation was held and he now presents for follow-up.    I had ordered an echo which obviously could not be done due to hospitalization. D/W family regarding anticoagulation again. In view of persistent bleeding noted by recent CT, will hold off anticoagulation until I discussed with greater, urology, presently the Foley catheter and urine output is very  clear.  I am not sure whether his acute renal failure and bladder obstruction was related to Xarelto leading to bleeding on request primarily related to underlying renal stones.  I also extensively discussed with the patient and his daughter regarding not anticoagulating him but patient and his daughter are very concerned about stroke risk and would prefer to be back on Xarelto.  I did discuss with Todd Campi, MD (Urology) and he does not see contraindication for resuming anticoagulation. Fortunately, renal function has returned to baseline stage IIIa.  He has developed new left leg edema since hospitalization, as he was not on anticoagulation, DVT needs to be excluded.  I will obtain venous duplex.  OV in 2 months for A. Fib, blood pressure is well controlled on spironolactone.   Adrian Prows, MD, [facility] 02/02/2020, 10:46 AM [facility] [place] Office: [phone]  CC: Dr. Deland Pretty, (PCP); Todd Campi, MD (Urology)

## 2020-02-03 DIAGNOSIS — N132 Hydronephrosis with renal and ureteral calculous obstruction: Secondary | ICD-10-CM | POA: Diagnosis not present

## 2020-02-03 DIAGNOSIS — I119 Hypertensive heart disease without heart failure: Secondary | ICD-10-CM | POA: Diagnosis not present

## 2020-02-03 DIAGNOSIS — Z466 Encounter for fitting and adjustment of urinary device: Secondary | ICD-10-CM | POA: Diagnosis not present

## 2020-02-03 DIAGNOSIS — R338 Other retention of urine: Secondary | ICD-10-CM | POA: Diagnosis not present

## 2020-02-04 MED ORDER — RIVAROXABAN 15 MG PO TABS: 15.0000 mg | ORAL_TABLET | Freq: Every day | ORAL | 6 refills | Status: DC

## 2020-02-06 DIAGNOSIS — Z466 Encounter for fitting and adjustment of urinary device: Secondary | ICD-10-CM | POA: Diagnosis not present

## 2020-02-06 DIAGNOSIS — R338 Other retention of urine: Secondary | ICD-10-CM | POA: Diagnosis not present

## 2020-02-06 DIAGNOSIS — I119 Hypertensive heart disease without heart failure: Secondary | ICD-10-CM | POA: Diagnosis not present

## 2020-02-06 DIAGNOSIS — N132 Hydronephrosis with renal and ureteral calculous obstruction: Secondary | ICD-10-CM | POA: Diagnosis not present

## 2020-02-07 DIAGNOSIS — N132 Hydronephrosis with renal and ureteral calculous obstruction: Secondary | ICD-10-CM | POA: Diagnosis not present

## 2020-02-07 DIAGNOSIS — R338 Other retention of urine: Secondary | ICD-10-CM | POA: Diagnosis not present

## 2020-02-07 DIAGNOSIS — Z466 Encounter for fitting and adjustment of urinary device: Secondary | ICD-10-CM | POA: Diagnosis not present

## 2020-02-07 DIAGNOSIS — I119 Hypertensive heart disease without heart failure: Secondary | ICD-10-CM | POA: Diagnosis not present

## 2020-02-08 ENCOUNTER — Ambulatory Visit (HOSPITAL_COMMUNITY)
Admission: RE | Admit: 2020-02-08 | Discharge: 2020-02-08 | Disposition: A | Discharge status: Home / Self Care | Payer: Medicare Other | Source: Ambulatory Visit | Attending: Cardiology | Admitting: Cardiology

## 2020-02-08 ENCOUNTER — Other Ambulatory Visit: Payer: Self-pay

## 2020-02-08 DIAGNOSIS — R600 Localized edema: Secondary | ICD-10-CM | POA: Diagnosis not present

## 2020-02-08 NOTE — Progress Notes (Signed)
Lower extremity venous has been completed.   Preliminary results in CV Proc.   Abram Sander 02/08/2020 2:23 PM

## 2020-02-09 DIAGNOSIS — R600 Localized edema: Secondary | ICD-10-CM | POA: Diagnosis not present

## 2020-02-09 DIAGNOSIS — I119 Hypertensive heart disease without heart failure: Secondary | ICD-10-CM | POA: Diagnosis not present

## 2020-02-09 DIAGNOSIS — E876 Hypokalemia: Secondary | ICD-10-CM | POA: Diagnosis not present

## 2020-02-09 DIAGNOSIS — R338 Other retention of urine: Secondary | ICD-10-CM | POA: Diagnosis not present

## 2020-02-09 DIAGNOSIS — N132 Hydronephrosis with renal and ureteral calculous obstruction: Secondary | ICD-10-CM | POA: Diagnosis not present

## 2020-02-09 DIAGNOSIS — L97921 Non-pressure chronic ulcer of unspecified part of left lower leg limited to breakdown of skin: Secondary | ICD-10-CM | POA: Diagnosis not present

## 2020-02-09 DIAGNOSIS — Z466 Encounter for fitting and adjustment of urinary device: Secondary | ICD-10-CM | POA: Diagnosis not present

## 2020-02-13 DIAGNOSIS — I119 Hypertensive heart disease without heart failure: Secondary | ICD-10-CM | POA: Diagnosis not present

## 2020-02-13 DIAGNOSIS — R338 Other retention of urine: Secondary | ICD-10-CM | POA: Diagnosis not present

## 2020-02-13 DIAGNOSIS — N132 Hydronephrosis with renal and ureteral calculous obstruction: Secondary | ICD-10-CM | POA: Diagnosis not present

## 2020-02-13 DIAGNOSIS — Z466 Encounter for fitting and adjustment of urinary device: Secondary | ICD-10-CM | POA: Diagnosis not present

## 2020-02-14 DIAGNOSIS — N132 Hydronephrosis with renal and ureteral calculous obstruction: Secondary | ICD-10-CM | POA: Diagnosis not present

## 2020-02-14 DIAGNOSIS — I119 Hypertensive heart disease without heart failure: Secondary | ICD-10-CM | POA: Diagnosis not present

## 2020-02-14 DIAGNOSIS — R338 Other retention of urine: Secondary | ICD-10-CM | POA: Diagnosis not present

## 2020-02-14 DIAGNOSIS — Z466 Encounter for fitting and adjustment of urinary device: Secondary | ICD-10-CM | POA: Diagnosis not present

## 2020-02-15 ENCOUNTER — Other Ambulatory Visit: Payer: Self-pay | Admitting: Internal Medicine

## 2020-02-15 DIAGNOSIS — R338 Other retention of urine: Secondary | ICD-10-CM | POA: Diagnosis not present

## 2020-02-15 DIAGNOSIS — I119 Hypertensive heart disease without heart failure: Secondary | ICD-10-CM | POA: Diagnosis not present

## 2020-02-15 DIAGNOSIS — Z466 Encounter for fitting and adjustment of urinary device: Secondary | ICD-10-CM | POA: Diagnosis not present

## 2020-02-15 DIAGNOSIS — N132 Hydronephrosis with renal and ureteral calculous obstruction: Secondary | ICD-10-CM | POA: Diagnosis not present

## 2020-02-15 DIAGNOSIS — R748 Abnormal levels of other serum enzymes: Secondary | ICD-10-CM

## 2020-02-16 DIAGNOSIS — Z466 Encounter for fitting and adjustment of urinary device: Secondary | ICD-10-CM | POA: Diagnosis not present

## 2020-02-16 DIAGNOSIS — R338 Other retention of urine: Secondary | ICD-10-CM | POA: Diagnosis not present

## 2020-02-16 DIAGNOSIS — N132 Hydronephrosis with renal and ureteral calculous obstruction: Secondary | ICD-10-CM | POA: Diagnosis not present

## 2020-02-21 DIAGNOSIS — N132 Hydronephrosis with renal and ureteral calculous obstruction: Secondary | ICD-10-CM | POA: Diagnosis not present

## 2020-02-21 DIAGNOSIS — R338 Other retention of urine: Secondary | ICD-10-CM | POA: Diagnosis not present

## 2020-02-21 DIAGNOSIS — I119 Hypertensive heart disease without heart failure: Secondary | ICD-10-CM | POA: Diagnosis not present

## 2020-02-21 DIAGNOSIS — Z466 Encounter for fitting and adjustment of urinary device: Secondary | ICD-10-CM | POA: Diagnosis not present

## 2020-02-22 ENCOUNTER — Ambulatory Visit
Admission: RE | Admit: 2020-02-22 | Discharge: 2020-02-22 | Disposition: A | Discharge status: Home / Self Care | Payer: Medicare Other | Source: Ambulatory Visit | Attending: Internal Medicine | Admitting: Internal Medicine

## 2020-02-22 DIAGNOSIS — R748 Abnormal levels of other serum enzymes: Secondary | ICD-10-CM | POA: Diagnosis not present

## 2020-02-28 DIAGNOSIS — I10 Essential (primary) hypertension: Secondary | ICD-10-CM | POA: Diagnosis not present

## 2020-02-28 DIAGNOSIS — R600 Localized edema: Secondary | ICD-10-CM | POA: Diagnosis not present

## 2020-03-02 DIAGNOSIS — R591 Generalized enlarged lymph nodes: Secondary | ICD-10-CM | POA: Diagnosis not present

## 2020-03-02 DIAGNOSIS — R338 Other retention of urine: Secondary | ICD-10-CM | POA: Diagnosis not present

## 2020-04-03 DIAGNOSIS — R338 Other retention of urine: Secondary | ICD-10-CM | POA: Diagnosis not present

## 2020-04-04 DIAGNOSIS — R338 Other retention of urine: Secondary | ICD-10-CM | POA: Diagnosis not present

## 2020-04-10 ENCOUNTER — Telehealth: Payer: Self-pay

## 2020-04-10 NOTE — Telephone Encounter (Signed)
Need echo only. NOt venous duplex

## 2020-04-11 ENCOUNTER — Other Ambulatory Visit: Payer: Self-pay

## 2020-04-11 ENCOUNTER — Ambulatory Visit: Payer: Medicare Other

## 2020-04-11 DIAGNOSIS — I10 Essential (primary) hypertension: Secondary | ICD-10-CM

## 2020-04-11 DIAGNOSIS — I482 Chronic atrial fibrillation, unspecified: Secondary | ICD-10-CM | POA: Diagnosis not present

## 2020-05-01 DIAGNOSIS — R338 Other retention of urine: Secondary | ICD-10-CM | POA: Diagnosis not present

## 2020-05-09 ENCOUNTER — Ambulatory Visit: Payer: Medicare Other | Admitting: Cardiology

## 2020-05-09 NOTE — Progress Notes (Deleted)
Primary Physician/Referring:  Deland Pretty, MD  Patient ID: Todd Wilkinson, male    DOB: 03-16-1925, 86 y.o.   MRN: [id number]  No chief complaint on file.  HPI:    Todd Wilkinson  is a 86 y.o. Caucasian male with mild hyperlipidemia, chronic atrial fibrillation diagnosed in Feb 2021 but otherwise no significant prior cardiovascular history, no history of fall, no stroke in the past, no history of hypertension or diabetes mellitus.   Admitted to the hospital on 01/07/2020 and discharged 8 days later with acute kidney injury related to obstructive uropathy with bilateral hydronephrosis, from renal stones, UTI and enlarged prostate. Had ureteral stones removed and stent placed which has now been removed from both ureters. Anticoagulation was held and he now presents for follow-up.  He has not had any further bleeding, he is presently has a Foley catheter in situ, urine output has been clear.  No abdominal pain, no chest pain or dyspnea.  His daughter is present.  Past Medical History:  Diagnosis Date  . Hyperlipidemia    Past Surgical History:  Procedure Laterality Date  . BACK SURGERY    . CYSTOSCOPY WITH STENT PLACEMENT Bilateral 01/09/2020   Procedure: CYSTOSCOPY WITH RETROGRADE PYELOGRAM AND STENT PLACEMENT;  Surgeon: Ceasar Mons, MD;  Location: [facility];  Service: Urology;  Laterality: Bilateral;   Social History   Tobacco Use  . Smoking status: Former Smoker    Quit date: 11/17/1946    Years since quitting: 73.5  . Smokeless tobacco: Never Used  Substance Use Topics  . Alcohol use: Not Currently    ROS  Review of Systems  Cardiovascular: Positive for claudication (bilateral calf) and leg swelling (left leg). Negative for dyspnea on exertion and near-syncope.  Hematologic/Lymphatic: Does not bruise/bleed easily.  Gastrointestinal: Negative for melena.   Objective  There were no vitals taken for this visit.  Vitals with BMI 02/02/2020 01/15/2020 01/14/2020   Height _0  - -  Weight 167 lbs 10 oz - -  BMI 03.00 - -  Systolic 923 300 762  Diastolic 71 66 82  Pulse 67 85 55     Physical Exam Constitutional:      Appearance: He is well-developed.  Cardiovascular:     Rate and Rhythm: Normal rate and regular rhythm.     Pulses:          Carotid pulses are 2+ on the right side and 2+ on the left side.      Femoral pulses are 2+ on the right side and 2+ on the left side.      Dorsalis pedis pulses are 1+ on the right side and 1+ on the left side.       Posterior tibial pulses are 0 on the right side and 0 on the left side.     Heart sounds: S1 normal and S2 normal. Murmur heard. High-pitched blowing midsystolic murmur is present with a grade of 2/6 at the apex radiating to the axilla.  No gallop.      Comments: 2+ left leg edema. No tenderness. No JVD.  Pulmonary:     Effort: Pulmonary effort is normal.     Breath sounds: Normal breath sounds.  Abdominal:     General: Bowel sounds are normal.     Palpations: Abdomen is soft.    Laboratory examination:   Recent Labs    01/13/20 0335 01/14/20 0426 01/15/20 0543  NA 147* 143  144 144  K 4.2 4.0  4.0 3.9  CL 119* 115*  115* 115*  CO2 21* 21*  22 23  GLUCOSE 104* 91  92 83  BUN 35* 29*  28* 25*  CREATININE 1.13 0.95  0.81 0.93  CALCIUM 8.2* 8.2*  8.3* 8.3*  GFRNONAA 56* >60  >60 >60  GFRAA >60 >60  >60 >60   CrCl cannot be calculated (Patient's most recent lab result is older than the maximum 21 days allowed.).  CMP Latest Ref Rng & Units 01/15/2020 01/14/2020 01/14/2020  Glucose 70 - 99 mg/dL 83 91 92  BUN 8 - 23 mg/dL 25(H) 29(H) 28(H)  Creatinine 0.61 - 1.24 mg/dL 0.93 0.95 0.81  Sodium 135 - 145 mmol/L 144 143 144  Potassium 3.5 - 5.1 mmol/L 3.9 4.0 4.0  Chloride 98 - 111 mmol/L 115(H) 115(H) 115(H)  CO2 22 - 32 mmol/L 23 21(L) 22  Calcium 8.9 - 10.3 mg/dL 8.3(L) 8.2(L) 8.3(L)  Total Protein 6.5 - 8.1 g/dL - - -  Total Bilirubin 0.3 - 1.2 mg/dL - - -  Alkaline  Phos 38 - 126 U/L - - -  AST 15 - 41 U/L - - -  ALT 0 - 44 U/L - - -   CBC Latest Ref Rng & Units 01/14/2020 01/11/2020 01/10/2020  WBC 4.0 - 10.5 K/uL 10.3 9.2 6.9  Hemoglobin 13.0 - 17.0 g/dL 10.1(L) 10.2(L) 10.0(L)  Hematocrit 39 - 52 % 31.1(L) 32.1(L) 29.5(L)  Platelets 150 - 400 K/uL 159 154 115(L)   Lipid Panel  No results found for: CHOL, TRIG, HDL, CHOLHDL, VLDL, LDLCALC, LDLDIRECT HEMOGLOBIN A1C No results found for: HGBA1C, MPG TSH No results for input(s): TSH in the last 8760 hours.  External labs :  Labs 10/18/2019: Serum glucose 90 mg, BUN 14, creatinine 1.20, EGFR 52 mL, potassium 4.2, sodium 140.  CMP normal.  Cholesterol 103, triglycerides 61, HDL 45, LDL 44.  Hb 11.8/HCT 35.0, platelets 129, normal indicis.  Medications and allergies  No Known Allergies   Current Outpatient Medications  Medication Instructions  . b complex vitamins capsule 1 capsule, Oral, Daily  . finasteride (PROSCAR) 5 mg, Oral, Daily  . LORazepam (ATIVAN) 0.5-1 mg, Oral, 2 times daily  . omeprazole (PRILOSEC) 20 mg, Oral, 2 times daily PRN  . Rivaroxaban (XARELTO) 15 mg, Oral, Daily with supper  . spironolactone (ALDACTONE) 25 mg, Oral, Daily, Patient daughter is confirming the strength.   . tamsulosin (FLOMAX) 0.4 mg, Oral, 2 times daily   Radiology:   CT Abdomen 01/08/2020: 1. Foley catheter in adequate position over the bladder. Mild symmetric bilateral hydronephrosis. Mild bilateral nephrolithiasis. No ureteral stones present. Hyperdense material over the left UPJ and proximal ureter as well as over the distal right ureter suggesting hemorrhagic material. Mild ascites. 2. Abnormal left periaortic lymph node at the level of the aortic bifurcation measuring 2.7 cm by short axis. 3.  Mild prostatic enlargement. 4.  Aortic Atherosclerosis (ICD10-I70.0).  Mild cardiomegaly.  Cardiac Studies:   Echocardiogram 04/11/2020:  Normal LV systolic function with EF 56%. Left ventricle cavity  is normal  in size. Moderate concentric hypertrophy of the left ventricle. Normal  global wall motion. Unable to evaluate diastolic function due to atrial  fibrillation. Calculated EF 56%.  Left atrial cavity is severely dilated at 5 cm. Volume index >60.  Right atrial cavity is severely dilated.  Right ventricle cavity is mildly dilated. Normal right ventricular  function.  Structurally normal mitral valve. Mild to moderate mitral regurgitation.  Moderate to severe tricuspid  regurgitation. Dilation of the tricuspid  valve annulus. Moderate pulmonary hypertension. RVSP measures 53 mmHg.  The aortic root is mildly dilated at 3.7 cm.  IVC is dilated with poor inspiration collapse consistent with elevated  right atrial pressure.  LE doppler 02/08/2020: RIGHT:  - No evidence of common femoral vein obstruction.  LEFT:  - There is no evidence of deep vein thrombosis in the lower extremity.  - No cystic structure found in the popliteal fossa.  EKG 12/26/2019: Atrial fibrillation with controlled ventricular response at rate of 48 bpm, left axis deviation, left anterior fascicular block.  Right bundle branch block.  Nonspecific T abnormality.  No significant change compared to 10/31/2019.    Assessment   No diagnosis found.  No orders of the defined types were placed in this encounter.   There are no discontinued medications.  Recommendations:   Todd Wilkinson  is a 86 y.o. Caucasian male with mild hyperlipidemia, chronic atrial fibrillation diagnosed in 12/26/2019 but otherwise no significant prior cardiovascular history, no history of fall, no stroke in the past, no history of hypertension or diabetes mellitus.  I also took care of his wife, Ms. Ronelle Nigh who passed away recently.    Admitted to the hospital on 01/07/2020 and discharged 8 days later with acute kidney injury related to obstructive uropathy with bilateral hydronephrosis, from renal stones, UTI and enlarged prostate. Had  ureteral stones removed and stent placed which has now been removed from both ureters. Anticoagulation was held and he now presents for follow-up.    I had ordered an echo which obviously could not be done due to hospitalization. D/W family regarding anticoagulation again. In view of persistent bleeding noted by recent CT, will hold off anticoagulation until I discussed with greater, urology, presently the Foley catheter and urine output is very clear.  I am not sure whether his acute renal failure and bladder obstruction was related to Xarelto leading to bleeding on request primarily related to underlying renal stones.  I also extensively discussed with the patient and his daughter regarding not anticoagulating him but patient and his daughter are very concerned about stroke risk and would prefer to be back on Xarelto.  I did discuss with Todd Campi, MD (Urology) and he does not see contraindication for resuming anticoagulation. Fortunately, renal function has returned to baseline stage IIIa.  He has developed new left leg edema since hospitalization, as he was not on anticoagulation, DVT needs to be excluded.  I will obtain venous duplex.  OV in 2 months for A. Fib, blood pressure is well controlled on spironolactone.   Richarda Osmond, MD, [facility] 05/09/2020, 11:53 AM [facility] [place] Office: [phone]  CC: Dr. Deland Pretty, (PCP); Todd Campi, MD (Urology)

## 2020-05-10 DIAGNOSIS — I10 Essential (primary) hypertension: Secondary | ICD-10-CM | POA: Diagnosis not present

## 2020-05-10 DIAGNOSIS — D519 Vitamin B12 deficiency anemia, unspecified: Secondary | ICD-10-CM | POA: Diagnosis not present

## 2020-05-10 DIAGNOSIS — L02416 Cutaneous abscess of left lower limb: Secondary | ICD-10-CM | POA: Diagnosis not present

## 2020-05-10 DIAGNOSIS — R600 Localized edema: Secondary | ICD-10-CM | POA: Diagnosis not present

## 2020-05-11 ENCOUNTER — Ambulatory Visit: Payer: Medicare Other | Admitting: Cardiology

## 2020-05-15 ENCOUNTER — Other Ambulatory Visit: Payer: Self-pay

## 2020-05-15 ENCOUNTER — Encounter: Payer: Self-pay | Admitting: Cardiology

## 2020-05-15 ENCOUNTER — Ambulatory Visit: Payer: Medicare Other | Admitting: Cardiology

## 2020-05-15 VITALS — BP 145/69 | HR 54 | Resp 16 | Ht 69.0 in | Wt 154.8 lb

## 2020-05-17 DIAGNOSIS — I872 Venous insufficiency (chronic) (peripheral): Secondary | ICD-10-CM | POA: Diagnosis not present

## 2020-05-17 DIAGNOSIS — I87312 Chronic venous hypertension (idiopathic) with ulcer of left lower extremity: Secondary | ICD-10-CM | POA: Diagnosis not present

## 2020-05-17 DIAGNOSIS — Z888 Allergy status to other drugs, medicaments and biological substances status: Secondary | ICD-10-CM | POA: Diagnosis not present

## 2020-05-17 DIAGNOSIS — L97211 Non-pressure chronic ulcer of right calf limited to breakdown of skin: Secondary | ICD-10-CM | POA: Diagnosis not present

## 2020-05-17 DIAGNOSIS — Z882 Allergy status to sulfonamides status: Secondary | ICD-10-CM | POA: Diagnosis not present

## 2020-05-17 DIAGNOSIS — L02416 Cutaneous abscess of left lower limb: Secondary | ICD-10-CM | POA: Diagnosis not present

## 2020-05-17 DIAGNOSIS — Z881 Allergy status to other antibiotic agents status: Secondary | ICD-10-CM | POA: Diagnosis not present

## 2020-05-17 DIAGNOSIS — L97221 Non-pressure chronic ulcer of left calf limited to breakdown of skin: Secondary | ICD-10-CM | POA: Diagnosis not present

## 2020-05-17 DIAGNOSIS — I87313 Chronic venous hypertension (idiopathic) with ulcer of bilateral lower extremity: Secondary | ICD-10-CM | POA: Diagnosis not present

## 2020-05-17 DIAGNOSIS — I890 Lymphedema, not elsewhere classified: Secondary | ICD-10-CM | POA: Diagnosis not present

## 2020-05-17 DIAGNOSIS — L97222 Non-pressure chronic ulcer of left calf with fat layer exposed: Secondary | ICD-10-CM | POA: Diagnosis not present

## 2020-05-17 DIAGNOSIS — L97212 Non-pressure chronic ulcer of right calf with fat layer exposed: Secondary | ICD-10-CM | POA: Diagnosis not present

## 2020-05-24 DIAGNOSIS — L97221 Non-pressure chronic ulcer of left calf limited to breakdown of skin: Secondary | ICD-10-CM | POA: Diagnosis not present

## 2020-05-24 DIAGNOSIS — I872 Venous insufficiency (chronic) (peripheral): Secondary | ICD-10-CM | POA: Diagnosis not present

## 2020-05-24 DIAGNOSIS — L97212 Non-pressure chronic ulcer of right calf with fat layer exposed: Secondary | ICD-10-CM | POA: Diagnosis not present

## 2020-05-24 DIAGNOSIS — I890 Lymphedema, not elsewhere classified: Secondary | ICD-10-CM | POA: Diagnosis not present

## 2020-05-24 DIAGNOSIS — L97222 Non-pressure chronic ulcer of left calf with fat layer exposed: Secondary | ICD-10-CM | POA: Diagnosis not present

## 2020-05-24 DIAGNOSIS — L97211 Non-pressure chronic ulcer of right calf limited to breakdown of skin: Secondary | ICD-10-CM | POA: Diagnosis not present

## 2020-06-01 ENCOUNTER — Other Ambulatory Visit: Payer: Self-pay

## 2020-06-01 ENCOUNTER — Encounter: Payer: Self-pay | Admitting: Cardiology

## 2020-06-01 ENCOUNTER — Ambulatory Visit: Payer: Medicare Other | Admitting: Cardiology

## 2020-06-01 VITALS — BP 135/55 | HR 62 | Resp 16 | Ht 69.0 in | Wt 155.0 lb

## 2020-06-01 DIAGNOSIS — N1831 Chronic kidney disease, stage 3a: Secondary | ICD-10-CM | POA: Diagnosis not present

## 2020-06-01 DIAGNOSIS — I4821 Permanent atrial fibrillation: Secondary | ICD-10-CM | POA: Diagnosis not present

## 2020-06-01 DIAGNOSIS — I10 Essential (primary) hypertension: Secondary | ICD-10-CM

## 2020-06-01 NOTE — Progress Notes (Signed)
Primary Physician/Referring:  Deland Pretty, MD  Patient ID: Todd Wilkinson, male    DOB: [birth year], 86 y.o.   MRN: [id number]  Chief Complaint  Patient presents with  . Atrial Fibrillation  . Follow-up    3 months   HPI:    Todd Wilkinson  is a 86 y.o. Caucasian male with mild hyperlipidemia, chronic atrial fibrillation diagnosed in Feb 2021 but otherwise no significant prior cardiovascular history, no history of fall, no stroke in the past, no history of hypertension or diabetes mellitus.   Admitted to the hospital on 01/07/2020 and discharged 8 days later with acute kidney injury related to obstructive uropathy with bilateral hydronephrosis, from renal stones, UTI and enlarged prostate. Had ureteral stones removed and stent placed which has now been removed from both ureters.    He has not had any further bleeding, he is presently has a Foley catheter in situ, urine output has been clear.  No abdominal pain, no chest pain or dyspnea.  His daughter is present. No specific complaints and tolerating Xarelto.   Past Medical History:  Diagnosis Date  . Hyperlipidemia    Past Surgical History:  Procedure Laterality Date  . BACK SURGERY    . CYSTOSCOPY WITH STENT PLACEMENT Bilateral 01/09/2020   Procedure: CYSTOSCOPY WITH RETROGRADE PYELOGRAM AND STENT PLACEMENT;  Surgeon: Ceasar Mons, MD;  Location: [facility];  Service: Urology;  Laterality: Bilateral;   Social History   Tobacco Use  . Smoking status: Former Smoker    Types: Cigarettes    Quit date: 11/17/1946    Years since quitting: 73.5  . Smokeless tobacco: Never Used  Substance Use Topics  . Alcohol use: Not Currently    ROS  Review of Systems  Cardiovascular: Positive for claudication (bilateral calf) and leg swelling (left leg). Negative for dyspnea on exertion and near-syncope.  Hematologic/Lymphatic: Does not bruise/bleed easily.  Gastrointestinal: Negative for melena.   Objective  Blood pressure (!)  135/55, pulse 62, resp. rate 16, height '5\' 9"'$  (1.753 m), weight 155 lb (70.3 kg), SpO2 97 %.  Vitals with BMI 06/01/2020 05/15/2020 02/02/2020  Height '5\' 9"'$  '5\' 9"'$  '5\' 9"'$   Weight 155 lbs 154 lbs 13 oz 167 lbs 10 oz  BMI 22.88 26.83 41.96  Systolic 222 979 892  Diastolic 55 69 71  Pulse 62 54 67     Physical Exam Constitutional:      Appearance: He is well-developed.  Cardiovascular:     Rate and Rhythm: Normal rate and regular rhythm.     Pulses:          Carotid pulses are 2+ on the right side and 2+ on the left side.      Femoral pulses are 2+ on the right side and 2+ on the left side.      Popliteal pulses are 2+ on the right side and 2+ on the left side.       Dorsalis pedis pulses are 2+ on the right side and 0 on the left side.       Posterior tibial pulses are 0 on the right side and 1+ on the left side.     Heart sounds: S1 normal and S2 normal. Murmur heard. High-pitched blowing midsystolic murmur is present with a grade of 2/6 at the apex radiating to the axilla.  No gallop.      Comments: No leg edema.  No JVD.  Pulmonary:     Effort: Pulmonary effort is normal.  Breath sounds: Normal breath sounds.  Abdominal:     General: Bowel sounds are normal.     Palpations: Abdomen is soft.    Laboratory examination:   Recent Labs    01/13/20 0335 01/14/20 0426 01/15/20 0543  NA 147* 143  144 144  K 4.2 4.0  4.0 3.9  CL 119* 115*  115* 115*  CO2 21* 21*  22 23  GLUCOSE 104* 91  92 83  BUN 35* 29*  28* 25*  CREATININE 1.13 0.95  0.81 0.93  CALCIUM 8.2* 8.2*  8.3* 8.3*  GFRNONAA 56* >60  >60 >60  GFRAA >60 >60  >60 >60   CrCl cannot be calculated (Patient's most recent lab result is older than the maximum 21 days allowed.).  CMP Latest Ref Rng & Units 01/15/2020 01/14/2020 01/14/2020  Glucose 70 - 99 mg/dL 83 91 92  BUN 8 - 23 mg/dL 44(B) 71(W) 78(N)  Creatinine 0.61 - 1.24 mg/dL 1.83 6.72 5.50  Sodium 135 - 145 mmol/L 144 143 144  Potassium 3.5 - 5.1  mmol/L 3.9 4.0 4.0  Chloride 98 - 111 mmol/L 115(H) 115(H) 115(H)  CO2 22 - 32 mmol/L 23 21(L) 22  Calcium 8.9 - 10.3 mg/dL 8.3(L) 8.2(L) 8.3(L)  Total Protein 6.5 - 8.1 g/dL - - -  Total Bilirubin 0.3 - 1.2 mg/dL - - -  Alkaline Phos 38 - 126 U/L - - -  AST 15 - 41 U/L - - -  ALT 0 - 44 U/L - - -   CBC Latest Ref Rng & Units 01/14/2020 01/11/2020 01/10/2020  WBC 4.0 - 10.5 K/uL 10.3 9.2 6.9  Hemoglobin 13.0 - 17.0 g/dL 10.1(L) 10.2(L) 10.0(L)  Hematocrit 39 - 52 % 31.1(L) 32.1(L) 29.5(L)  Platelets 150 - 400 K/uL 159 154 115(L)   External labs :  Labs 10/18/2019: Serum glucose 90 mg, BUN 14, creatinine 1.20, EGFR 52 mL, potassium 4.2, sodium 140.  CMP normal.  Cholesterol 103, triglycerides 61, HDL 45, LDL 44.  Hb 11.8/HCT 35.0, platelets 129, normal indicis.  Medications and allergies  No Known Allergies   Current Outpatient Medications  Medication Instructions  . b complex vitamins capsule 1 capsule, Oral, Daily  . finasteride (PROSCAR) 5 mg, Oral, Daily  . KLOR-CON 10 10 MEQ tablet 1 mEq, Oral, Daily  . LORazepam (ATIVAN) 0.5-1 mg, Oral, 2 times daily  . omeprazole (PRILOSEC) 20 mg, Oral, 2 times daily PRN  . Rivaroxaban (XARELTO) 15 mg, Oral, Daily with supper  . spironolactone (ALDACTONE) 25 mg, Oral, Daily, Patient daughter is confirming the strength.   . tamsulosin (FLOMAX) 0.4 mg, Oral, 2 times daily   Radiology:   CT Abdomen 01/08/2020: 1. Foley catheter in adequate position over the bladder. Mild symmetric bilateral hydronephrosis. Mild bilateral nephrolithiasis. No ureteral stones present. Hyperdense material over the left UPJ and proximal ureter as well as over the distal right ureter suggesting hemorrhagic material. Mild ascites. 2. Abnormal left periaortic lymph node at the level of the aortic bifurcation measuring 2.7 cm by short axis. 3.  Mild prostatic enlargement. 4.  Aortic Atherosclerosis (ICD10-I70.0).  Mild cardiomegaly.  Cardiac Studies:   Lower  Extremity Venous Duplex  02/08/2020: RIGHT:  - No evidence of common femoral vein obstruction.    LEFT:  - There is no evidence of deep vein thrombosis in the lower extremity.    - No cystic structure found in the popliteal fossa.    Echocardiogram 04/11/2020:  Normal LV systolic function with EF 56%.  Left ventricle cavity is normal  in size. Moderate concentric hypertrophy of the left ventricle. Normal  global wall motion. Unable to evaluate diastolic function due to atrial  fibrillation. Calculated EF 56%.  Left atrial cavity is severely dilated at 5 cm. Volume index >60.  Right atrial cavity is severely dilated.  Right ventricle cavity is mildly dilated. Normal right ventricular  function.  Structurally normal mitral valve.  Mild to moderate mitral regurgitation.  Moderate to severe tricuspid regurgitation. Dilation of the tricuspid  valve annulus. Moderate pulmonary hypertension. RVSP measures 53 mmHg.  The aortic root is mildly dilated at 3.7 cm.  IVC is dilated with poor inspiration collapse consistent with elevated  right atrial pressure.   EKG:   EKG 12/26/2019: Atrial fibrillation with controlled ventricular response at rate of 48 bpm, left axis deviation, left anterior fascicular block.  Right bundle branch block.  Nonspecific T abnormality.  No significant change compared to 10/31/2019.    Assessment     ICD-10-CM   1. Permanent atrial fibrillation ([place]). CHA2DS2-VASc Score is 4.  Yearly risk of stroke: 4%(A, HTN, PAD).  I48.21   2. Stage 3a chronic kidney disease  N18.31   3. Systolic hypertension  U38     No orders of the defined types were placed in this encounter.   Medications Discontinued During This Encounter  Medication Reason  . amoxicillin (AMOXIL) 875 MG tablet Completed Course    Recommendations:   ANIRUDH BAIZ  is a 86 y.o. Caucasian male with mild hyperlipidemia, chronic atrial fibrillation diagnosed in 12/26/2019 but otherwise no significant prior  cardiovascular history, no history of fall, no stroke in the past, no history of hypertension or diabetes mellitus.  I also took care of his wife, Ms. Ronelle Nigh who passed away a year ago.    Admitted to the hospital on 01/07/2020 and discharged 8 days later with acute kidney injury related to obstructive uropathy with bilateral hydronephrosis, from renal stones, UTI and enlarged prostate.  He is presently doing well, tolerating anticoagulation, she has not had any further urinary bleeding, still has Foley catheter in place.  Heart rate is well controlled, no clinical evidence of heart failure.  Both patient and his daughter prefer him to be on anticoagulation.  I will see him back in 6 months for follow-up.  Although he has moderate pulmonary hypertension, no clinical evidence of heart failure.  Leg edema has completely resolved.  No changes in the medications were done today.  Blood pressure is also well controlled.   Adrian Prows, MD, [facility] 06/01/2020, 9:24 AM Office: [phone]

## 2020-06-05 DIAGNOSIS — R338 Other retention of urine: Secondary | ICD-10-CM | POA: Diagnosis not present

## 2020-06-05 DIAGNOSIS — N130 Hydronephrosis with ureteropelvic junction obstruction: Secondary | ICD-10-CM | POA: Diagnosis not present

## 2020-06-09 NOTE — Progress Notes (Signed)
Appointment changed to another day

## 2020-06-21 DIAGNOSIS — R338 Other retention of urine: Secondary | ICD-10-CM | POA: Diagnosis not present

## 2020-06-28 DIAGNOSIS — L97211 Non-pressure chronic ulcer of right calf limited to breakdown of skin: Secondary | ICD-10-CM | POA: Diagnosis not present

## 2020-06-28 DIAGNOSIS — I890 Lymphedema, not elsewhere classified: Secondary | ICD-10-CM | POA: Diagnosis not present

## 2020-06-28 DIAGNOSIS — I872 Venous insufficiency (chronic) (peripheral): Secondary | ICD-10-CM | POA: Diagnosis not present

## 2020-06-28 DIAGNOSIS — L97221 Non-pressure chronic ulcer of left calf limited to breakdown of skin: Secondary | ICD-10-CM | POA: Diagnosis not present

## 2020-06-29 DIAGNOSIS — H00012 Hordeolum externum right lower eyelid: Secondary | ICD-10-CM | POA: Diagnosis not present

## 2020-06-29 DIAGNOSIS — H00015 Hordeolum externum left lower eyelid: Secondary | ICD-10-CM | POA: Diagnosis not present

## 2020-07-05 DIAGNOSIS — R338 Other retention of urine: Secondary | ICD-10-CM | POA: Diagnosis not present

## 2020-07-10 DIAGNOSIS — H35373 Puckering of macula, bilateral: Secondary | ICD-10-CM | POA: Diagnosis not present

## 2020-07-10 DIAGNOSIS — H26491 Other secondary cataract, right eye: Secondary | ICD-10-CM | POA: Diagnosis not present

## 2020-07-10 DIAGNOSIS — H353132 Nonexudative age-related macular degeneration, bilateral, intermediate dry stage: Secondary | ICD-10-CM | POA: Diagnosis not present

## 2020-07-10 DIAGNOSIS — H0100A Unspecified blepharitis right eye, upper and lower eyelids: Secondary | ICD-10-CM | POA: Diagnosis not present

## 2020-08-01 DIAGNOSIS — R338 Other retention of urine: Secondary | ICD-10-CM | POA: Diagnosis not present

## 2020-08-31 DIAGNOSIS — R338 Other retention of urine: Secondary | ICD-10-CM | POA: Diagnosis not present

## 2020-09-23 ENCOUNTER — Other Ambulatory Visit: Payer: Self-pay | Admitting: Cardiology

## 2020-09-23 DIAGNOSIS — I4821 Permanent atrial fibrillation: Secondary | ICD-10-CM

## 2020-10-02 DIAGNOSIS — R338 Other retention of urine: Secondary | ICD-10-CM | POA: Diagnosis not present

## 2020-10-30 DIAGNOSIS — I10 Essential (primary) hypertension: Secondary | ICD-10-CM | POA: Diagnosis not present

## 2020-10-30 DIAGNOSIS — R338 Other retention of urine: Secondary | ICD-10-CM | POA: Diagnosis not present

## 2020-11-05 DIAGNOSIS — Z0001 Encounter for general adult medical examination with abnormal findings: Secondary | ICD-10-CM | POA: Diagnosis not present

## 2020-11-05 DIAGNOSIS — I739 Peripheral vascular disease, unspecified: Secondary | ICD-10-CM | POA: Diagnosis not present

## 2020-11-05 DIAGNOSIS — E7800 Pure hypercholesterolemia, unspecified: Secondary | ICD-10-CM | POA: Diagnosis not present

## 2020-11-05 DIAGNOSIS — Z23 Encounter for immunization: Secondary | ICD-10-CM | POA: Diagnosis not present

## 2020-11-05 DIAGNOSIS — K5900 Constipation, unspecified: Secondary | ICD-10-CM | POA: Diagnosis not present

## 2020-11-29 DIAGNOSIS — R338 Other retention of urine: Secondary | ICD-10-CM | POA: Diagnosis not present

## 2020-12-06 ENCOUNTER — Encounter: Payer: Self-pay | Admitting: Cardiology

## 2020-12-06 ENCOUNTER — Ambulatory Visit: Payer: Medicare Other | Admitting: Cardiology

## 2020-12-06 ENCOUNTER — Other Ambulatory Visit: Payer: Self-pay

## 2020-12-06 VITALS — BP 134/70 | HR 63 | Temp 98.2°F | Resp 16 | Ht 69.0 in | Wt 161.0 lb

## 2020-12-06 DIAGNOSIS — I071 Rheumatic tricuspid insufficiency: Secondary | ICD-10-CM | POA: Diagnosis not present

## 2020-12-06 DIAGNOSIS — I10 Essential (primary) hypertension: Secondary | ICD-10-CM

## 2020-12-06 DIAGNOSIS — I4821 Permanent atrial fibrillation: Secondary | ICD-10-CM | POA: Diagnosis not present

## 2020-12-06 DIAGNOSIS — I340 Nonrheumatic mitral (valve) insufficiency: Secondary | ICD-10-CM | POA: Diagnosis not present

## 2020-12-06 DIAGNOSIS — N1831 Chronic kidney disease, stage 3a: Secondary | ICD-10-CM

## 2020-12-06 NOTE — Progress Notes (Signed)
Primary Physician/Referring:  Todd Pretty, MD  Patient ID: [name] Reasons, male    DOB: [birth year], 87 y.o.   MRN: [id number]  Chief Complaint  Patient presents with  . Permanent atrial fibrillation   . Follow-up    6 month   HPI:    RAKESH DUTKO  is a 87 y.o. Caucasian male with mild hyperlipidemia, chronic atrial fibrillation diagnosed in Feb 2021 but otherwise no significant prior cardiovascular history, no history of fall, no stroke in the past, no history of hypertension or diabetes mellitus.   Admitted to the hospital on 01/07/2020 and discharged 8 days later with acute kidney injury related to obstructive uropathy with bilateral hydronephrosis, from renal stones, UTI and enlarged prostate. Had ureteral stones removed and stent placed which has now been removed from both ureters.    He has not had any further bleeding, he is presently has a Foley catheter in situ, urine output has been clear.  No abdominal pain, no chest pain or dyspnea.  His daughter is present. No specific complaints and tolerating Xarelto.   Past Medical History:  Diagnosis Date  . Hyperlipidemia    Past Surgical History:  Procedure Laterality Date  . BACK SURGERY    . CYSTOSCOPY WITH STENT PLACEMENT Bilateral 01/09/2020   Procedure: CYSTOSCOPY WITH RETROGRADE PYELOGRAM AND STENT PLACEMENT;  Surgeon: Ceasar Mons, MD;  Location: [facility];  Service: Urology;  Laterality: Bilateral;   Social History   Tobacco Use  . Smoking status: Former Smoker    Types: Cigarettes    Quit date: 11/17/1946    Years since quitting: 74.1  . Smokeless tobacco: Never Used  Substance Use Topics  . Alcohol use: Not Currently    ROS  Review of Systems  Cardiovascular: Positive for claudication (bilateral calf cramps at night). Negative for dyspnea on exertion and near-syncope.  Hematologic/Lymphatic: Does not bruise/bleed easily.  Gastrointestinal: Negative for melena.  Genitourinary: Positive for hesitancy  (has indwelling catheter).   Objective  Blood pressure 134/70, pulse 63, temperature 98.2 F (36.8 C), temperature source Temporal, resp. rate 16, height $RemoveBe'5\' 9"'ROKmVcsRb$  (1.753 m), weight 161 lb (73 kg), SpO2 97 %.  Vitals with BMI 12/06/2020 06/01/2020 05/15/2020  Height $Remov'5\' 9"'doFeTL$  $Remove'5\' 9"'bTOBMkP$  $RemoveB'5\' 9"'GKvdZESO$   Weight 161 lbs 155 lbs 154 lbs 13 oz  BMI 23.76 65.78 46.96  Systolic 295 284 132  Diastolic 70 55 69  Pulse 63 62 54     Physical Exam Constitutional:      Appearance: He is well-developed.  Cardiovascular:     Rate and Rhythm: Normal rate and regular rhythm.     Pulses: A midsystolic click.          Carotid pulses are 2+ on the right side and 2+ on the left side.      Femoral pulses are 2+ on the right side and 2+ on the left side.      Popliteal pulses are 2+ on the right side and 2+ on the left side.       Dorsalis pedis pulses are 2+ on the right side and 0 on the left side.       Posterior tibial pulses are 0 on the right side and 1+ on the left side.     Heart sounds: S1 normal and S2 normal. Murmur heard.   Crescendo-decrescendo mid to late systolic murmur is present with a grade of 2/6 at the apex. No gallop.      Comments: No leg edema.  No JVD.  Pulmonary:     Effort: Pulmonary effort is normal.     Breath sounds: Normal breath sounds.  Abdominal:     General: Bowel sounds are normal.     Palpations: Abdomen is soft.    Laboratory examination:   Recent Labs    01/13/20 0335 01/14/20 0426 01/15/20 0543  NA 147* 143  144 144  K 4.2 4.0  4.0 3.9  CL 119* 115*  115* 115*  CO2 21* 21*  22 23  GLUCOSE 104* 91  92 83  BUN 35* 29*  28* 25*  CREATININE 1.13 0.95  0.81 0.93  CALCIUM 8.2* 8.2*  8.3* 8.3*  GFRNONAA 56* >60  >60 >60  GFRAA >60 >60  >60 >60   CrCl cannot be calculated (Patient's most recent lab result is older than the maximum 21 days allowed.).  CMP Latest Ref Rng & Units 01/15/2020 01/14/2020 01/14/2020  Glucose 70 - 99 mg/dL 83 91 92  BUN 8 - 23 mg/dL 81(C) 61(P)  69(G)  Creatinine 0.61 - 1.24 mg/dL 0.98 2.86 7.51  Sodium 135 - 145 mmol/L 144 143 144  Potassium 3.5 - 5.1 mmol/L 3.9 4.0 4.0  Chloride 98 - 111 mmol/L 115(H) 115(H) 115(H)  CO2 22 - 32 mmol/L 23 21(L) 22  Calcium 8.9 - 10.3 mg/dL 8.3(L) 8.2(L) 8.3(L)  Total Protein 6.5 - 8.1 g/dL - - -  Total Bilirubin 0.3 - 1.2 mg/dL - - -  Alkaline Phos 38 - 126 U/L - - -  AST 15 - 41 U/L - - -  ALT 0 - 44 U/L - - -   CBC Latest Ref Rng & Units 01/14/2020 01/11/2020 01/10/2020  WBC 4.0 - 10.5 K/uL 10.3 9.2 6.9  Hemoglobin 13.0 - 17.0 g/dL 10.1(L) 10.2(L) 10.0(L)  Hematocrit 39.0 - 52.0 % 31.1(L) 32.1(L) 29.5(L)  Platelets 150 - 400 K/uL 159 154 115(L)   External labs :  Labs 10/18/2019: Serum glucose 90 mg, BUN 14, creatinine 1.20, EGFR 52 mL, potassium 4.2, sodium 140.  CMP normal.  Cholesterol 103, triglycerides 61, HDL 45, LDL 44.  Hb 11.8/HCT 35.0, platelets 129, normal indicis.  Medications and allergies  No Known Allergies   Current Outpatient Medications on File Prior to Visit  Medication Sig Dispense Refill  . b complex vitamins capsule Take 1 capsule by mouth daily.    . finasteride (PROSCAR) 5 MG tablet Take 1 tablet (5 mg total) by mouth daily. 30 tablet 3  . KLOR-CON 10 10 MEQ tablet Take 1 mEq by mouth daily.    Marland Kitchen omeprazole (PRILOSEC) 20 MG capsule Take 20 mg by mouth daily as needed.    Marland Kitchen spironolactone (ALDACTONE) 25 MG tablet Take 25 mg by mouth daily. Patient daughter is confirming the strength.    . tamsulosin (FLOMAX) 0.4 MG CAPS capsule Take 1 capsule (0.4 mg total) by mouth 2 (two) times daily. 60 capsule 2  . XARELTO 15 MG TABS tablet TAKE 1 TABLET BY MOUTH DAILY WITH SUPPER. 30 tablet 6  . LORazepam (ATIVAN) 0.5 MG tablet Take 0.5-1 mg by mouth 2 (two) times daily.  (Patient not taking: Reported on 12/06/2020)     No current facility-administered medications on file prior to visit.    Radiology:   CT Abdomen 01/08/2020: 1. Foley catheter in adequate position over  the bladder. Mild symmetric bilateral hydronephrosis. Mild bilateral nephrolithiasis. No ureteral stones present. Hyperdense material over the left UPJ and proximal ureter as well as over the distal  right ureter suggesting hemorrhagic material. Mild ascites. 2. Abnormal left periaortic lymph node at the level of the aortic bifurcation measuring 2.7 cm by short axis. 3.  Mild prostatic enlargement. 4.  Aortic Atherosclerosis (ICD10-I70.0).  Mild cardiomegaly.  Cardiac Studies:   Lower Extremity Venous Duplex  02/08/2020: RIGHT:  - No evidence of common femoral vein obstruction.    LEFT:  - There is no evidence of deep vein thrombosis in the lower extremity.    - No cystic structure found in the popliteal fossa.    Echocardiogram 04/11/2020:  Normal LV systolic function with EF 56%. Left ventricle cavity is normal  in size. Moderate concentric hypertrophy of the left ventricle. Normal  global wall motion. Unable to evaluate diastolic function due to atrial  fibrillation. Calculated EF 56%.  Left atrial cavity is severely dilated at 5 cm. Volume index >60.  Right atrial cavity is severely dilated.  Right ventricle cavity is mildly dilated. Normal right ventricular  function.  Structurally normal mitral valve.  Mild to moderate mitral regurgitation.  Moderate to severe tricuspid regurgitation. Dilation of the tricuspid  valve annulus. Moderate pulmonary hypertension. RVSP measures 53 mmHg.  The aortic root is mildly dilated at 3.7 cm.  IVC is dilated with poor inspiration collapse consistent with elevated  right atrial pressure.   EKG:   EKG 12/06/2020: Atrial fibrillation with controlled ventricular response at rate of 53 bpm, left axis deviation, left intrafascicular block.  Right bundle branch block.  Bifascicular block.  No significant change from 12/26/2019.  Assessment     ICD-10-CM   1. Permanent atrial fibrillation ([facility])  I48.21 EKG 12-Lead    PCV ECHOCARDIOGRAM COMPLETE  2.  Stage 3a chronic kidney disease ([facility])  N18.31   3. Systolic hypertension  X44   4. Moderate mitral regurgitation  I34.0 PCV ECHOCARDIOGRAM COMPLETE  5. Severe tricuspid regurgitation  I07.1 PCV ECHOCARDIOGRAM COMPLETE    CHA2DS2-VASc Score is 3.  Yearly risk of stroke: 3.2% (A, Aortic atherosclerosis).  Score of 1=0.6; 2=2.2; 3=3.2; 4=4.8; 5=7.2; 6=9.8; 7=>9.8) -(CHF; HTN; vasc disease DM,  Male = 1; Age <65 =0; 65-74 = 1,  >75 =2; stroke/embolism= 2).   Orders Placed This Encounter  Procedures  . EKG 12-Lead  . PCV ECHOCARDIOGRAM COMPLETE    Standing Status:   Future    Standing Expiration Date:   12/06/2021    No orders of the defined types were placed in this encounter.  There are no discontinued medications.  Recommendations:   DEANGLO HISSONG  is a 87 y.o. Caucasian male with mild hyperlipidemia, chronic atrial fibrillation diagnosed in 12/26/2019 but otherwise no significant prior cardiovascular history, no history of fall, no stroke in the past, no history of hypertension or diabetes mellitus.  I also took care of his wife, Ms. Ronelle Nigh who passed away a year ago.  Admitted to the hospital on 01/07/2020 and discharged 8 days later with acute kidney injury related to obstructive uropathy with bilateral hydronephrosis, from renal stones, UTI and enlarged prostate.   He is presently doing well, tolerating anticoagulation, he has not had any further urinary bleeding, still has Foley catheter in place.  Heart rate is well controlled, no clinical evidence of heart failure.  Both patient and his daughter prefer him to be on anticoagulation, I have discussed with them regarding watchman device.  He prefers to be on anticoagulation to reduce risk of stroke, his CHA2DS2-VASc score is at most 3.0 or less.  I will see him back  in 6 months for follow-up.  In view of moderate MR and severe TR, I will repeat echocardiogram.  Presently on Aldactone for chronic leg edema.  He is on appropriate medical  therapy.  No clinical evidence of heart failure.  I will also obtain the labs from PCP performed a month ago respiratory to follow-up on renal disease and CBC.     Adrian Prows, MD, [facility] 12/06/2020, 9:19 PM Office: [phone]

## 2020-12-21 DIAGNOSIS — H0100A Unspecified blepharitis right eye, upper and lower eyelids: Secondary | ICD-10-CM | POA: Diagnosis not present

## 2020-12-21 DIAGNOSIS — H00012 Hordeolum externum right lower eyelid: Secondary | ICD-10-CM | POA: Diagnosis not present

## 2020-12-24 DIAGNOSIS — N130 Hydronephrosis with ureteropelvic junction obstruction: Secondary | ICD-10-CM | POA: Diagnosis not present

## 2020-12-24 DIAGNOSIS — R338 Other retention of urine: Secondary | ICD-10-CM | POA: Diagnosis not present

## 2021-01-02 ENCOUNTER — Other Ambulatory Visit: Payer: Self-pay

## 2021-01-02 ENCOUNTER — Ambulatory Visit: Payer: Medicare Other

## 2021-01-02 DIAGNOSIS — I340 Nonrheumatic mitral (valve) insufficiency: Secondary | ICD-10-CM

## 2021-01-02 DIAGNOSIS — I071 Rheumatic tricuspid insufficiency: Secondary | ICD-10-CM | POA: Diagnosis not present

## 2021-01-02 DIAGNOSIS — I4821 Permanent atrial fibrillation: Secondary | ICD-10-CM

## 2021-01-14 DIAGNOSIS — E7800 Pure hypercholesterolemia, unspecified: Secondary | ICD-10-CM | POA: Diagnosis not present

## 2021-01-14 DIAGNOSIS — K219 Gastro-esophageal reflux disease without esophagitis: Secondary | ICD-10-CM | POA: Diagnosis not present

## 2021-01-14 DIAGNOSIS — I4821 Permanent atrial fibrillation: Secondary | ICD-10-CM | POA: Diagnosis not present

## 2021-01-23 NOTE — Progress Notes (Signed)
I discussed the results personally with the patient.

## 2021-01-24 DIAGNOSIS — R338 Other retention of urine: Secondary | ICD-10-CM | POA: Diagnosis not present

## 2021-02-28 DIAGNOSIS — R338 Other retention of urine: Secondary | ICD-10-CM | POA: Diagnosis not present

## 2021-03-28 DIAGNOSIS — R338 Other retention of urine: Secondary | ICD-10-CM | POA: Diagnosis not present

## 2021-05-02 DIAGNOSIS — R338 Other retention of urine: Secondary | ICD-10-CM | POA: Diagnosis not present

## 2021-05-10 ENCOUNTER — Other Ambulatory Visit: Payer: Self-pay | Admitting: Cardiology

## 2021-05-10 DIAGNOSIS — I4821 Permanent atrial fibrillation: Secondary | ICD-10-CM

## 2021-05-16 DIAGNOSIS — E7800 Pure hypercholesterolemia, unspecified: Secondary | ICD-10-CM | POA: Diagnosis not present

## 2021-05-16 DIAGNOSIS — K219 Gastro-esophageal reflux disease without esophagitis: Secondary | ICD-10-CM | POA: Diagnosis not present

## 2021-05-16 DIAGNOSIS — I4821 Permanent atrial fibrillation: Secondary | ICD-10-CM | POA: Diagnosis not present

## 2021-05-30 DIAGNOSIS — R338 Other retention of urine: Secondary | ICD-10-CM | POA: Diagnosis not present

## 2021-06-06 ENCOUNTER — Ambulatory Visit: Payer: Medicare Other | Admitting: Cardiology

## 2021-06-06 ENCOUNTER — Other Ambulatory Visit: Payer: Self-pay

## 2021-06-06 ENCOUNTER — Encounter: Payer: Self-pay | Admitting: Cardiology

## 2021-06-06 VITALS — BP 131/64 | HR 75 | Temp 98.3°F | Resp 16 | Ht 69.0 in | Wt 160.0 lb

## 2021-06-06 DIAGNOSIS — R600 Localized edema: Secondary | ICD-10-CM | POA: Diagnosis not present

## 2021-06-06 DIAGNOSIS — D649 Anemia, unspecified: Secondary | ICD-10-CM | POA: Diagnosis not present

## 2021-06-06 DIAGNOSIS — I340 Nonrheumatic mitral (valve) insufficiency: Secondary | ICD-10-CM | POA: Diagnosis not present

## 2021-06-06 DIAGNOSIS — I4821 Permanent atrial fibrillation: Secondary | ICD-10-CM

## 2021-06-06 DIAGNOSIS — N1831 Chronic kidney disease, stage 3a: Secondary | ICD-10-CM | POA: Diagnosis not present

## 2021-06-06 DIAGNOSIS — Z7901 Long term (current) use of anticoagulants: Secondary | ICD-10-CM | POA: Diagnosis not present

## 2021-06-06 DIAGNOSIS — I071 Rheumatic tricuspid insufficiency: Secondary | ICD-10-CM

## 2021-06-06 NOTE — Progress Notes (Signed)
Primary Physician/Referring:  Deland Pretty, MD  Patient ID: Todd Wilkinson, male    DOB: 05/16/1925, 87 y.o.   MRN: [id number]  Chief Complaint  Patient presents with   Atrial Fibrillation   Follow-up    6 months   HPI:    Todd Wilkinson  is a 87 y.o. Caucasian male with mild hyperlipidemia, chronic atrial fibrillation diagnosed in Feb 2021 but otherwise no significant prior cardiovascular history, no history of fall, no stroke in the past, no history of hypertension or diabetes mellitus.   Admitted to the hospital on 01/07/2020 and discharged 8 days later with acute kidney injury related to obstructive uropathy with bilateral hydronephrosis, from renal stones, UTI and enlarged prostate, since then has a indwelling urinary catheter.  He is presently tolerating all his medications well including Xarelto without any bleeding diathesis.  He accidentally injured his left shin last week.  No history of fall or syncope.  Denies chest pains, shortness of breath, worsening leg edema.  Past Medical History:  Diagnosis Date   Hyperlipidemia    Past Surgical History:  Procedure Laterality Date   BACK SURGERY     CYSTOSCOPY WITH STENT PLACEMENT Bilateral 01/09/2020   Procedure: CYSTOSCOPY WITH RETROGRADE PYELOGRAM AND STENT PLACEMENT;  Surgeon: Ceasar Mons, MD;  Location: [facility];  Service: Urology;  Laterality: Bilateral;   Social History   Tobacco Use   Smoking status: Former    Types: Cigarettes    Quit date: 11/17/1946    Years since quitting: 74.6   Smokeless tobacco: Never  Substance Use Topics   Alcohol use: Not Currently    ROS  Review of Systems  Cardiovascular:  Negative for claudication, dyspnea on exertion and near-syncope.  Hematologic/Lymphatic: Does not bruise/bleed easily.  Gastrointestinal:  Negative for melena.  Genitourinary:  Positive for hesitancy (has indwelling catheter).  Neurological:  Negative for headaches and loss of balance.  Objective   Blood pressure 131/64, pulse 75, temperature 98.3 F (36.8 C), temperature source Temporal, resp. rate 16, height $RemoveBe'5\' 9"'pHSXuJbrk$  (1.753 m), weight 160 lb (72.6 kg), SpO2 98 %.  Vitals with BMI 06/06/2021 12/06/2020 06/01/2020  Height $Remov'5\' 9"'sUXJqd$  $Remove'5\' 9"'QRlmsrP$  $RemoveB'5\' 9"'TWUdgiTF$   Weight 160 lbs 161 lbs 155 lbs  BMI 23.62 34.19 62.22  Systolic 979 892 119  Diastolic 64 70 55  Pulse 75 63 62     Physical Exam Constitutional:      Appearance: He is well-developed.  Neck:     Vascular: No carotid bruit or JVD.  Cardiovascular:     Rate and Rhythm: Normal rate and regular rhythm.     Pulses:          Carotid pulses are 2+ on the right side and 2+ on the left side.      Femoral pulses are 2+ on the right side and 2+ on the left side.      Popliteal pulses are 2+ on the right side and 2+ on the left side.       Dorsalis pedis pulses are 2+ on the right side and 0 on the left side.       Posterior tibial pulses are 0 on the right side and 1+ on the left side.     Heart sounds: S1 normal and S2 normal. A midsystolic click. Murmur heard.  Crescendo-decrescendo mid to late systolic murmur is present with a grade of 2/6 at the apex.    No gallop.  Pulmonary:     Effort: Pulmonary  effort is normal.     Breath sounds: Normal breath sounds.  Abdominal:     General: Bowel sounds are normal.     Palpations: Abdomen is soft.  Musculoskeletal:        General: Normal range of motion.     Right lower leg: No edema.     Left lower leg: Edema (Trace) present.  Skin:    Findings: Bruising (Superficial laceration on his left shin) present.   Laboratory examination:   No results for input(s): NA, K, CL, CO2, GLUCOSE, BUN, CREATININE, CALCIUM, GFRNONAA, GFRAA in the last 8760 hours.  CrCl cannot be calculated (Patient's most recent lab result is older than the maximum 21 days allowed.).  CMP Latest Ref Rng & Units 01/15/2020 01/14/2020 01/14/2020  Glucose 70 - 99 mg/dL 83 91 92  BUN 8 - 23 mg/dL 25(H) 29(H) 28(H)  Creatinine 0.61 - 1.24  mg/dL 0.93 0.95 0.81  Sodium 135 - 145 mmol/L 144 143 144  Potassium 3.5 - 5.1 mmol/L 3.9 4.0 4.0  Chloride 98 - 111 mmol/L 115(H) 115(H) 115(H)  CO2 22 - 32 mmol/L 23 21(L) 22  Calcium 8.9 - 10.3 mg/dL 8.3(L) 8.2(L) 8.3(L)  Total Protein 6.5 - 8.1 g/dL - - -  Total Bilirubin 0.3 - 1.2 mg/dL - - -  Alkaline Phos 38 - 126 U/L - - -  AST 15 - 41 U/L - - -  ALT 0 - 44 U/L - - -   CBC Latest Ref Rng & Units 01/14/2020 01/11/2020 01/10/2020  WBC 4.0 - 10.5 K/uL 10.3 9.2 6.9  Hemoglobin 13.0 - 17.0 g/dL 10.1(L) 10.2(L) 10.0(L)  Hematocrit 39.0 - 52.0 % 31.1(L) 32.1(L) 29.5(L)  Platelets 150 - 400 K/uL 159 154 115(L)   External labs :   Labs 10/18/2019: Serum glucose 90 mg, BUN 14, creatinine 1.20, EGFR 52 mL, potassium 4.2, sodium 140.  CMP normal.  Cholesterol 103, triglycerides 61, HDL 45, LDL 44.  Hb 11.8/HCT 35.0, platelets 129, normal indicis.  Medications and allergies  No Known Allergies   Current Outpatient Medications on File Prior to Visit  Medication Sig Dispense Refill   b complex vitamins capsule Take 1 capsule by mouth daily.     finasteride (PROSCAR) 5 MG tablet Take 1 tablet (5 mg total) by mouth daily. 30 tablet 3   KLOR-CON 10 10 MEQ tablet Take 1 mEq by mouth daily.     LORazepam (ATIVAN) 0.5 MG tablet Take 0.5-1 mg by mouth 2 (two) times daily.     omeprazole (PRILOSEC) 20 MG capsule Take 20 mg by mouth daily as needed.     spironolactone (ALDACTONE) 25 MG tablet Take 25 mg by mouth daily. Patient daughter is confirming the strength.     tamsulosin (FLOMAX) 0.4 MG CAPS capsule Take 1 capsule (0.4 mg total) by mouth 2 (two) times daily. (Patient taking differently: Take 0.4 mg by mouth at bedtime.) 60 capsule 2   XARELTO 15 MG TABS tablet TAKE 1 TABLET BY MOUTH DAILY WITH SUPPER 30 tablet 6   No current facility-administered medications on file prior to visit.    Radiology:   CT Abdomen 01/08/2020: 1. Foley catheter in adequate position over the bladder. Mild  symmetric bilateral hydronephrosis. Mild bilateral nephrolithiasis. No ureteral stones present. Hyperdense material over the left UPJ and proximal ureter as well as over the distal right ureter suggesting hemorrhagic material. Mild ascites. 2. Abnormal left periaortic lymph node at the level of the aortic bifurcation measuring 2.7 cm  by short axis. 3.  Mild prostatic enlargement. 4.  Aortic Atherosclerosis (ICD10-I70.0).  Mild cardiomegaly.  Cardiac Studies:   Lower Extremity Venous Duplex  02/08/2020: RIGHT:  - No evidence of common femoral vein obstruction.  LEFT:  - There is no evidence of deep vein thrombosis in the lower extremity.  - No cystic structure found in the popliteal fossa.   PCV ECHOCARDIOGRAM COMPLETE 53/64/6803 Normal LV systolic function with EF 56%. Left ventricle cavity is normal in size. Moderate concentric hypertrophy of the left ventricle. Normal global wall motion. Unable to evaluate diastolic function due to atrial fibrillation. Calculated EF 56%. Left atrial cavity is severely dilated at 5 cm. Volume index >60. Right atrial cavity is severely dilated. Right ventricle cavity is mildly dilated. Normal right ventricular function. Structurally normal mitral valve.  Mild to moderate mitral regurgitation. Moderate to severe tricuspid regurgitation. Dilation of the tricuspid valve annulus. Moderate pulmonary hypertension. RVSP measures 53 mmHg. The aortic root is mildly dilated at 3.7 cm. 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 No significant change from 01/02/2021, 04/11/2020.     EKG:   EKG 06/06/2020: Atrial fibrillation with controlled ventricular response at rate of 56 bpm, left axis deviation, left anterior fascicular block.  Right bundle branch block.  Nonspecific T abnormality.  Bifascicular block. No significant change from EKG 12/06/2020:  Assessment     ICD-10-CM   1. Permanent atrial fibrillation  (HCC)  O12.24 Basic metabolic panel    CBC    EKG 12-Lead    2. Stage 3a chronic kidney disease (HCC)  N18.31     3. Moderate mitral regurgitation  I34.0     4. Severe tricuspid regurgitation  I07.1     5. Leg edema, left  R60.0     6. Chronic anemia  D64.9 Vitamin B12    Folate    Iron and TIBC    Ferritin    7. Anticoagulant long-term use  Z79.01       CHA2DS2-VASc Score is 3.  Yearly risk of stroke: 3.2% (A, Aortic atherosclerosis).  Score of 1=0.6; 2=2.2; 3=3.2; 4=4.8; 5=7.2; 6=9.8; 7=>9.8) -(CHF HTN; vasc disease DM,  Male = 1; Age <65 =0; 65-74 = 1,  >75 =2; stroke/embolism= 2).   Orders Placed This Encounter  Procedures   Basic metabolic panel   CBC   Vitamin B12   Folate   Iron and TIBC   Ferritin   EKG 12-Lead    No orders of the defined types were placed in this encounter.  There are no discontinued medications.  Recommendations:   Todd Wilkinson  is a 87 y.o. Caucasian male with mild hyperlipidemia, chronic atrial fibrillation diagnosed in 12/26/2019 but otherwise no significant prior cardiovascular history, no history of fall, no stroke in the past, no history of hypertension or diabetes mellitus.  Admitted to the hospital on 01/07/2020 and discharged 8 days later with acute kidney injury related to obstructive uropathy with bilateral hydronephrosis, from renal stones, UTI and enlarged prostate.  Since then he has a indwelling catheter in place.  He is presently doing well, tolerating anticoagulation, he has not had any further urinary bleeding.  He has a big laceration, which is superficial on his left shin after he hit the end of the table.  No history of fall or syncope.  The wound appears to be healing with granulation tissue and there is no evidence of infection.  Daughter has been dressing this with Neosporin and  keeping it open at home while he is not outside.  I reviewed potential for watchman device, patient is presently 87 years of age and will be 14  soon, prefers not to have any procedures.  He still continues to live independently.  As long as there is no bleeding risk we will continue anticoagulation.  This is as per his preference.  His CHA2DS2-VASc is only 3.  He has chronic anemia, will check iron levels and B12 and folate levels as well.  He needs monitoring of his anticoagulation, CBC and BMP ordered today.  He is on spironolactone for chronic leg edema and has responded well.  No change in the echocardiogram.  I will see him back in 6 months for follow-up.      Adrian Prows, MD, [facility] 06/06/2021, 9:38 AM Office: [phone]

## 2021-06-07 LAB — VITAMIN B12: Vitamin B-12: 1311 pg/mL — ABNORMAL HIGH (ref 232–1245)

## 2021-06-07 LAB — IRON AND TIBC
Iron Saturation: 34 % (ref 15–55)
Iron: 87 ug/dL (ref 38–169)
Total Iron Binding Capacity: 255 ug/dL (ref 250–450)
UIBC: 168 ug/dL (ref 111–343)

## 2021-06-07 LAB — BASIC METABOLIC PANEL
BUN/Creatinine Ratio: 13 (ref 10–24)
BUN: 17 mg/dL (ref 10–36)
CO2: 22 mmol/L (ref 20–29)
Calcium: 9.3 mg/dL (ref 8.6–10.2)
Chloride: 103 mmol/L (ref 96–106)
Creatinine, Ser: 1.34 mg/dL — ABNORMAL HIGH (ref 0.76–1.27)
Glucose: 93 mg/dL (ref 65–99)
Potassium: 4.7 mmol/L (ref 3.5–5.2)
Sodium: 139 mmol/L (ref 134–144)
eGFR: 49 mL/min/{1.73_m2} — ABNORMAL LOW (ref >59)

## 2021-06-07 LAB — CBC
Hematocrit: 36.3 % — ABNORMAL LOW (ref 37.5–51.0)
Hemoglobin: 12.9 g/dL — ABNORMAL LOW (ref 13.0–17.7)
MCH: 35 pg — ABNORMAL HIGH (ref 26.6–33.0)
MCHC: 35.5 g/dL (ref 31.5–35.7)
MCV: 98 fL — ABNORMAL HIGH (ref 79–97)
Platelets: 134 10*3/uL — ABNORMAL LOW (ref 150–450)
RBC: 3.69 x10E6/uL — ABNORMAL LOW (ref 4.14–5.80)
RDW: 12.4 % (ref 11.6–15.4)
WBC: 7.1 10*3/uL (ref 3.4–10.8)

## 2021-06-07 LAB — FERRITIN: Ferritin: 107 ng/mL (ref 30–400)

## 2021-06-07 LAB — FOLATE: Folate: 5.5 ng/mL (ref >3.0)

## 2021-06-09 NOTE — Progress Notes (Signed)
Kidney function is slightly deteriorated but overall appears stable, blood counts has improved.  Iron studies are normal.  Labs look very stable, will forward a copy to PCP.

## 2021-06-10 NOTE — Progress Notes (Signed)
Called and spoke to pt, he seemed a little confused so I called his daughter and she voiced understanding.

## 2021-06-12 DIAGNOSIS — L03116 Cellulitis of left lower limb: Secondary | ICD-10-CM | POA: Diagnosis not present

## 2021-06-12 DIAGNOSIS — S81802A Unspecified open wound, left lower leg, initial encounter: Secondary | ICD-10-CM | POA: Diagnosis not present

## 2021-06-13 DIAGNOSIS — I872 Venous insufficiency (chronic) (peripheral): Secondary | ICD-10-CM | POA: Diagnosis not present

## 2021-06-13 DIAGNOSIS — Z9889 Other specified postprocedural states: Secondary | ICD-10-CM | POA: Diagnosis not present

## 2021-06-13 DIAGNOSIS — L97822 Non-pressure chronic ulcer of other part of left lower leg with fat layer exposed: Secondary | ICD-10-CM | POA: Diagnosis not present

## 2021-06-13 DIAGNOSIS — L97222 Non-pressure chronic ulcer of left calf with fat layer exposed: Secondary | ICD-10-CM | POA: Diagnosis not present

## 2021-06-17 DIAGNOSIS — I87312 Chronic venous hypertension (idiopathic) with ulcer of left lower extremity: Secondary | ICD-10-CM | POA: Diagnosis not present

## 2021-06-20 DIAGNOSIS — I872 Venous insufficiency (chronic) (peripheral): Secondary | ICD-10-CM | POA: Diagnosis not present

## 2021-06-20 DIAGNOSIS — L97222 Non-pressure chronic ulcer of left calf with fat layer exposed: Secondary | ICD-10-CM | POA: Diagnosis not present

## 2021-06-28 DIAGNOSIS — R338 Other retention of urine: Secondary | ICD-10-CM | POA: Diagnosis not present

## 2021-06-28 DIAGNOSIS — R591 Generalized enlarged lymph nodes: Secondary | ICD-10-CM | POA: Diagnosis not present

## 2021-07-11 ENCOUNTER — Other Ambulatory Visit (HOSPITAL_COMMUNITY): Payer: Self-pay | Admitting: Urology

## 2021-07-11 DIAGNOSIS — R972 Elevated prostate specific antigen [PSA]: Secondary | ICD-10-CM

## 2021-07-11 DIAGNOSIS — R591 Generalized enlarged lymph nodes: Secondary | ICD-10-CM

## 2021-07-17 DIAGNOSIS — I4821 Permanent atrial fibrillation: Secondary | ICD-10-CM | POA: Diagnosis not present

## 2021-07-17 DIAGNOSIS — E7800 Pure hypercholesterolemia, unspecified: Secondary | ICD-10-CM | POA: Diagnosis not present

## 2021-07-17 DIAGNOSIS — K219 Gastro-esophageal reflux disease without esophagitis: Secondary | ICD-10-CM | POA: Diagnosis not present

## 2021-07-25 DIAGNOSIS — R338 Other retention of urine: Secondary | ICD-10-CM | POA: Diagnosis not present

## 2021-07-30 ENCOUNTER — Ambulatory Visit (HOSPITAL_COMMUNITY)
Admission: RE | Admit: 2021-07-30 | Discharge: 2021-07-30 | Disposition: A | Discharge status: Home / Self Care | Payer: Medicare Other | Source: Ambulatory Visit | Attending: Urology | Admitting: Urology

## 2021-07-30 ENCOUNTER — Other Ambulatory Visit: Payer: Self-pay

## 2021-07-30 DIAGNOSIS — R591 Generalized enlarged lymph nodes: Secondary | ICD-10-CM | POA: Diagnosis not present

## 2021-07-30 DIAGNOSIS — R972 Elevated prostate specific antigen [PSA]: Secondary | ICD-10-CM | POA: Diagnosis present

## 2021-07-30 DIAGNOSIS — R590 Localized enlarged lymph nodes: Secondary | ICD-10-CM | POA: Diagnosis not present

## 2021-07-30 DIAGNOSIS — C7951 Secondary malignant neoplasm of bone: Secondary | ICD-10-CM | POA: Diagnosis not present

## 2021-07-30 MED ORDER — PIFLIFOLASTAT F 18 (PYLARIFY) INJECTION
9.0000 | Freq: Once | INTRAVENOUS | Status: AC
  Administered 2021-07-30: 8.47 via INTRAVENOUS

## 2021-08-22 DIAGNOSIS — C7951 Secondary malignant neoplasm of bone: Secondary | ICD-10-CM | POA: Diagnosis not present

## 2021-08-22 DIAGNOSIS — R338 Other retention of urine: Secondary | ICD-10-CM | POA: Diagnosis not present

## 2021-09-16 DIAGNOSIS — H35373 Puckering of macula, bilateral: Secondary | ICD-10-CM | POA: Diagnosis not present

## 2021-09-16 DIAGNOSIS — H353132 Nonexudative age-related macular degeneration, bilateral, intermediate dry stage: Secondary | ICD-10-CM | POA: Diagnosis not present

## 2021-09-16 DIAGNOSIS — H26491 Other secondary cataract, right eye: Secondary | ICD-10-CM | POA: Diagnosis not present

## 2021-09-16 DIAGNOSIS — H0102A Squamous blepharitis right eye, upper and lower eyelids: Secondary | ICD-10-CM | POA: Diagnosis not present

## 2021-09-16 DIAGNOSIS — H3562 Retinal hemorrhage, left eye: Secondary | ICD-10-CM | POA: Diagnosis not present

## 2021-09-19 DIAGNOSIS — R338 Other retention of urine: Secondary | ICD-10-CM | POA: Diagnosis not present

## 2021-10-12 ENCOUNTER — Other Ambulatory Visit: Payer: Self-pay

## 2021-10-12 ENCOUNTER — Emergency Department (HOSPITAL_BASED_OUTPATIENT_CLINIC_OR_DEPARTMENT_OTHER)
Admission: EM | Admit: 2021-10-12 | Discharge: 2021-10-12 | Disposition: A | Discharge status: Home / Self Care | Payer: Medicare Other | Attending: Emergency Medicine | Admitting: Emergency Medicine

## 2021-10-12 ENCOUNTER — Encounter (HOSPITAL_BASED_OUTPATIENT_CLINIC_OR_DEPARTMENT_OTHER): Payer: Self-pay | Admitting: *Deleted

## 2021-10-12 DIAGNOSIS — X58XXXA Exposure to other specified factors, initial encounter: Secondary | ICD-10-CM | POA: Insufficient documentation

## 2021-10-12 DIAGNOSIS — R339 Retention of urine, unspecified: Secondary | ICD-10-CM | POA: Diagnosis not present

## 2021-10-12 DIAGNOSIS — Z87891 Personal history of nicotine dependence: Secondary | ICD-10-CM | POA: Insufficient documentation

## 2021-10-12 DIAGNOSIS — R1030 Lower abdominal pain, unspecified: Secondary | ICD-10-CM | POA: Diagnosis not present

## 2021-10-12 DIAGNOSIS — Z8546 Personal history of malignant neoplasm of prostate: Secondary | ICD-10-CM | POA: Insufficient documentation

## 2021-10-12 DIAGNOSIS — Z7901 Long term (current) use of anticoagulants: Secondary | ICD-10-CM | POA: Insufficient documentation

## 2021-10-12 DIAGNOSIS — S81812A Laceration without foreign body, left lower leg, initial encounter: Secondary | ICD-10-CM | POA: Diagnosis not present

## 2021-10-12 DIAGNOSIS — I10 Essential (primary) hypertension: Secondary | ICD-10-CM | POA: Diagnosis not present

## 2021-10-12 DIAGNOSIS — Z79899 Other long term (current) drug therapy: Secondary | ICD-10-CM | POA: Insufficient documentation

## 2021-10-12 DIAGNOSIS — S81811A Laceration without foreign body, right lower leg, initial encounter: Secondary | ICD-10-CM | POA: Insufficient documentation

## 2021-10-12 DIAGNOSIS — I4891 Unspecified atrial fibrillation: Secondary | ICD-10-CM | POA: Diagnosis not present

## 2021-10-12 DIAGNOSIS — S8991XA Unspecified injury of right lower leg, initial encounter: Secondary | ICD-10-CM | POA: Diagnosis present

## 2021-10-12 DIAGNOSIS — I2510 Atherosclerotic heart disease of native coronary artery without angina pectoris: Secondary | ICD-10-CM | POA: Insufficient documentation

## 2021-10-12 DIAGNOSIS — S81819A Laceration without foreign body, unspecified lower leg, initial encounter: Secondary | ICD-10-CM

## 2021-10-12 HISTORY — DX: Atherosclerotic heart disease of native coronary artery without angina pectoris: I25.10

## 2021-10-12 HISTORY — DX: Malignant neoplasm of prostate: C61

## 2021-10-12 HISTORY — DX: Unspecified atrial fibrillation: I48.91

## 2021-10-12 LAB — URINALYSIS, ROUTINE W REFLEX MICROSCOPIC
Bilirubin Urine: NEGATIVE
Glucose, UA: NEGATIVE mg/dL
Ketones, ur: NEGATIVE mg/dL
Nitrite: NEGATIVE
Protein, ur: 30 mg/dL — AB
RBC / HPF: >50 RBC/hpf — ABNORMAL HIGH (ref 0–5)
Specific Gravity, Urine: 1.017 (ref 1.005–1.030)
WBC, UA: >50 WBC/hpf — ABNORMAL HIGH (ref 0–5)
pH: 7.5 (ref 5.0–8.0)

## 2021-10-12 LAB — BASIC METABOLIC PANEL
Anion gap: 11 (ref 5–15)
BUN: 19 mg/dL (ref 8–23)
CO2: 22 mmol/L (ref 22–32)
Calcium: 9.6 mg/dL (ref 8.9–10.3)
Chloride: 98 mmol/L (ref 98–111)
Creatinine, Ser: 1.22 mg/dL (ref 0.61–1.24)
GFR, Estimated: 55 mL/min — ABNORMAL LOW (ref >60)
Glucose, Bld: 140 mg/dL — ABNORMAL HIGH (ref 70–99)
Potassium: 3.7 mmol/L (ref 3.5–5.1)
Sodium: 131 mmol/L — ABNORMAL LOW (ref 135–145)

## 2021-10-12 LAB — CBC WITH DIFFERENTIAL/PLATELET
Abs Immature Granulocytes: 0.06 10*3/uL (ref 0.00–0.07)
Basophils Absolute: 0 10*3/uL (ref 0.0–0.1)
Basophils Relative: 0 %
Eosinophils Absolute: 0.1 10*3/uL (ref 0.0–0.5)
Eosinophils Relative: 1 %
HCT: 35.2 % — ABNORMAL LOW (ref 39.0–52.0)
Hemoglobin: 12.2 g/dL — ABNORMAL LOW (ref 13.0–17.0)
Immature Granulocytes: 1 %
Lymphocytes Relative: 11 %
Lymphs Abs: 1 10*3/uL (ref 0.7–4.0)
MCH: 34.3 pg — ABNORMAL HIGH (ref 26.0–34.0)
MCHC: 34.7 g/dL (ref 30.0–36.0)
MCV: 98.9 fL (ref 80.0–100.0)
Monocytes Absolute: 1.1 10*3/uL — ABNORMAL HIGH (ref 0.1–1.0)
Monocytes Relative: 12 %
Neutro Abs: 7 10*3/uL (ref 1.7–7.7)
Neutrophils Relative %: 75 %
Platelets: 140 10*3/uL — ABNORMAL LOW (ref 150–400)
RBC: 3.56 MIL/uL — ABNORMAL LOW (ref 4.22–5.81)
RDW: 13.2 % (ref 11.5–15.5)
WBC: 9.3 10*3/uL (ref 4.0–10.5)
nRBC: 0 % (ref 0.0–0.2)

## 2021-10-12 MED ORDER — CEPHALEXIN 500 MG PO CAPS: 500.0000 mg | ORAL_CAPSULE | Freq: Four times a day (QID) | ORAL | 0 refills | Status: DC

## 2021-10-12 NOTE — ED Notes (Signed)
Right lower leg wound cleansed and covered with mepilex.

## 2021-10-12 NOTE — ED Triage Notes (Signed)
Pt present with urinary retention, No urine in foley bag since yesterday morning. Upon arrival foley cath only in urethra about 3 inches. Removed foley without difficulty.

## 2021-10-12 NOTE — Discharge Instructions (Addendum)
You were seen in the emergency department for a malpositioned Foley catheter and lower abdominal pain.  A catheter was replaced with improvement in your symptoms.  Your urine showed some signs of infection so we are covering you with some antibiotics.  Please follow-up with [facility].  You also had a skin tear on your right lower leg.  Please observe for signs of infection.  Return if any worsening or concerning symptoms.

## 2021-10-12 NOTE — ED Provider Notes (Signed)
[facility] EMERGENCY DEPT Provider Note   CSN: [id number] Arrival date & time: 10/12/21  1437     History Chief Complaint  Patient presents with   Urinary Retention    Todd Wilkinson is a 87 y.o. male.  He is brought in by his daughters for evaluation of disrupted Foley.  Patient complaining of a little bit of low abdominal pain.  Unclear how long the Foley has been out of position.  Patient denies any chest pain or shortness of breath.  He also has an abrasion on his right leg below his knee. Denies fall.  The history is provided by the patient and a relative.  Abdominal Pain Pain location:  Suprapubic Pain quality: aching   Pain severity:  Moderate Onset quality:  Gradual Duration:  2 days Timing:  Constant Progression:  Worsening Chronicity:  New Relieved by:  None tried Worsened by:  Nothing Ineffective treatments:  None tried Associated symptoms: no chest pain, no cough, no fever, no shortness of breath and no sore throat       Past Medical History:  Diagnosis Date   Atrial fibrillation ([place])    Coronary artery disease    Hyperlipidemia    Prostate cancer [facility])     Patient Active Problem List   Diagnosis Date Noted   AF (paroxysmal atrial fibrillation) ([place]) 01/08/2020   Essential hypertension 01/08/2020   Hyperlipidemia 01/08/2020   BPH (benign prostatic hyperplasia) 01/08/2020   Urinary tract infection 01/08/2020   Abdominal mass, LLQ (left lower quadrant) 01/08/2020   AKI (acute kidney injury) ([place]) 01/07/2020    Past Surgical History:  Procedure Laterality Date   BACK SURGERY     CYSTOSCOPY WITH STENT PLACEMENT Bilateral 01/09/2020   Procedure: CYSTOSCOPY WITH RETROGRADE PYELOGRAM AND STENT PLACEMENT;  Surgeon: Ceasar Mons, MD;  Location: [facility];  Service: Urology;  Laterality: Bilateral;       Family History  Problem Relation Age of Onset   Cancer Mother    Heart attack Father     Social History   Tobacco Use    Smoking status: Former    Types: Cigarettes    Quit date: 11/17/1946    Years since quitting: 74.9   Smokeless tobacco: Never  Vaping Use   Vaping Use: Never used  Substance Use Topics   Alcohol use: Not Currently   Drug use: Never    Home Medications Prior to Admission medications   Medication Sig Start Date End Date Taking? Authorizing Provider  b complex vitamins capsule Take 1 capsule by mouth daily.   Yes [provider]  finasteride (PROSCAR) 5 MG tablet Take 1 tablet (5 mg total) by mouth daily. 01/14/20  Yes Rai, Ripudeep K, MD  KLOR-CON 10 10 MEQ tablet Take 1 mEq by mouth daily. 01/25/20  Yes [provider]  LORazepam (ATIVAN) 0.5 MG tablet Take 0.5-1 mg by mouth 2 (two) times daily.   Yes [provider]  omeprazole (PRILOSEC) 20 MG capsule Take 20 mg by mouth daily as needed.   Yes [provider]  spironolactone (ALDACTONE) 25 MG tablet Take 25 mg by mouth daily. Patient daughter is confirming the strength.   Yes [provider]  XARELTO 15 MG TABS tablet TAKE 1 TABLET BY MOUTH DAILY WITH SUPPER 05/10/21  Yes Adrian Prows, MD  tamsulosin (FLOMAX) 0.4 MG CAPS capsule Take 1 capsule (0.4 mg total) by mouth 2 (two) times daily. Patient taking differently: Take 0.4 mg by mouth at bedtime. 01/14/20  Rai, Vernelle Emerald, MD    Allergies    Ciprofloxacin  Review of Systems   Review of Systems  Constitutional:  Negative for fever.  HENT:  Negative for sore throat.   Eyes:  Negative for visual disturbance.  Respiratory:  Negative for cough and shortness of breath.   Cardiovascular:  Negative for chest pain.  Gastrointestinal:  Positive for abdominal pain.  Genitourinary:  Positive for difficulty urinating.  Musculoskeletal:  Negative for neck pain.  Skin:  Positive for wound.  Neurological:  Negative for headaches.   Physical Exam Updated Vital Signs BP (!) 150/64 (BP Location: Left Arm)   Pulse (!) 59   Temp 97.7 F (36.5 C)    Resp 17   Ht 5' 7.5" (1.715 m)   Wt 74.4 kg   SpO2 99%   BMI 25.31 kg/m   Physical Exam Vitals and nursing note reviewed.  Constitutional:      General: He is not in acute distress.    Appearance: Normal appearance. He is well-developed.  HENT:     Head: Normocephalic and atraumatic.  Eyes:     Conjunctiva/sclera: Conjunctivae normal.  Cardiovascular:     Rate and Rhythm: Normal rate and regular rhythm.     Heart sounds: No murmur heard. Pulmonary:     Effort: Pulmonary effort is normal. No respiratory distress.     Breath sounds: Normal breath sounds.  Abdominal:     Palpations: Abdomen is soft.     Tenderness: There is abdominal tenderness (Suprapubic).  Musculoskeletal:        General: Signs of injury present. No swelling.     Cervical back: Neck supple.     Comments: Right lower leg skin tear below knee.  No obvious signs of infection.  Skin:    General: Skin is warm and dry.     Capillary Refill: Capillary refill takes less than 2 seconds.  Neurological:     General: No focal deficit present.     Mental Status: He is alert.     Sensory: No sensory deficit.     Motor: No weakness.  Psychiatric:        Mood and Affect: Mood normal.    ED Results / Procedures / Treatments   Labs (all labs ordered are listed, but only abnormal results are displayed) Labs Reviewed  BASIC METABOLIC PANEL - Abnormal; Notable for the following components:      Result Value   Sodium 131 (*)    Glucose, Bld 140 (*)    GFR, Estimated 55 (*)    All other components within normal limits  CBC WITH DIFFERENTIAL/PLATELET - Abnormal; Notable for the following components:   RBC 3.56 (*)    Hemoglobin 12.2 (*)    HCT 35.2 (*)    MCH 34.3 (*)    Platelets 140 (*)    Monocytes Absolute 1.1 (*)    All other components within normal limits  URINALYSIS, ROUTINE W REFLEX MICROSCOPIC - Abnormal; Notable for the following components:   APPearance CLOUDY (*)    Hgb urine dipstick LARGE (*)     Protein, ur 30 (*)    Leukocytes,Ua LARGE (*)    RBC / HPF >50 (*)    WBC, UA >50 (*)    Bacteria, UA MANY (*)    All other components within normal limits  URINE CULTURE    EKG None  Radiology No results found.  Procedures Procedures   Medications Ordered in ED Medications - No data  to display  ED Course  I have reviewed the triage vital signs and the nursing notes.  Pertinent labs & imaging results that were available during my care of the patient were reviewed by me and considered in my medical decision making (see chart for details).  Clinical Course as of 10/12/21 1618  Sat Oct 12, 2021  1617 Patient had a Foley placed by nursing.  Had significant relief of his lower abdominal pain without.  Urinalysis has greater than 50 reds greater than 50 whites so we will cover with antibiotics.  Checking labs to make sure renal function is preserved. [MB]    Clinical Course User Index [MB] Hayden Rasmussen, MD   MDM Rules/Calculators/A&P                          This patient complains of low abdominal pain and Foley malposition, skin tear right leg; this involves an extensive number of treatment Options and is a complaint that carries with it a high risk of complications and Morbidity. The differential includes UTI, urinary retention, renal failure, metabolic derangement  I ordered, reviewed and interpreted labs, which included CBC with normal white count, hemoglobin low stable from priors, chemistries with mildly low sodium elevated glucose, normal renal function, urinalysis possible signs of infection greater than 50 reds greater than 50 whites many bacteria.  Additional history obtained from patient's daughters  Previous records obtained and reviewed in epic No recent admissions  After the interventions stated above, I reevaluated the patient and found patient to be feeling symptomatically improved after Foley placement.  He has good urine flow.  Patient's skin tear was  cleaned and dressed by nursing.  He is comfortable plan for discharge.  Return instructions discussed   Final Clinical Impression(s) / ED Diagnoses Final diagnoses:  Urinary retention  Skin tear of lower leg without complication, initial encounter    Rx / DC Orders ED Discharge Orders          Ordered    cephALEXin (KEFLEX) 500 MG capsule  4 times daily        10/12/21 1625             Hayden Rasmussen, MD 10/13/21 [phone]

## 2021-10-15 LAB — URINE CULTURE: Culture: >=100000 — AB

## 2021-10-21 DIAGNOSIS — C7951 Secondary malignant neoplasm of bone: Secondary | ICD-10-CM | POA: Diagnosis not present

## 2021-10-21 DIAGNOSIS — R310 Gross hematuria: Secondary | ICD-10-CM | POA: Diagnosis not present

## 2021-11-04 DIAGNOSIS — I10 Essential (primary) hypertension: Secondary | ICD-10-CM | POA: Diagnosis not present

## 2021-11-04 DIAGNOSIS — Z79899 Other long term (current) drug therapy: Secondary | ICD-10-CM | POA: Diagnosis not present

## 2021-11-06 DIAGNOSIS — R338 Other retention of urine: Secondary | ICD-10-CM | POA: Diagnosis not present

## 2021-11-07 DIAGNOSIS — R413 Other amnesia: Secondary | ICD-10-CM | POA: Diagnosis not present

## 2021-11-07 DIAGNOSIS — N1831 Chronic kidney disease, stage 3a: Secondary | ICD-10-CM | POA: Diagnosis not present

## 2021-11-07 DIAGNOSIS — I119 Hypertensive heart disease without heart failure: Secondary | ICD-10-CM | POA: Diagnosis not present

## 2021-11-07 DIAGNOSIS — I700 Atherosclerosis of aorta: Secondary | ICD-10-CM | POA: Diagnosis not present

## 2021-11-07 DIAGNOSIS — Z0000 Encounter for general adult medical examination without abnormal findings: Secondary | ICD-10-CM | POA: Diagnosis not present

## 2021-11-07 DIAGNOSIS — I4821 Permanent atrial fibrillation: Secondary | ICD-10-CM | POA: Diagnosis not present

## 2021-11-07 DIAGNOSIS — I10 Essential (primary) hypertension: Secondary | ICD-10-CM | POA: Diagnosis not present

## 2021-11-07 DIAGNOSIS — I272 Pulmonary hypertension, unspecified: Secondary | ICD-10-CM | POA: Diagnosis not present

## 2021-11-26 IMAGING — US US ABDOMEN LIMITED
1 series · 14 of 25 positions shown · non-contrast
Comparison: None.

CLINICAL DATA: Hyperbilirubinemia

EXAM:
ULTRASOUND ABDOMEN LIMITED RIGHT UPPER QUADRANT

[Series 1: us abdomen limited · 14 of 43 slices shown]
[im 1/43]
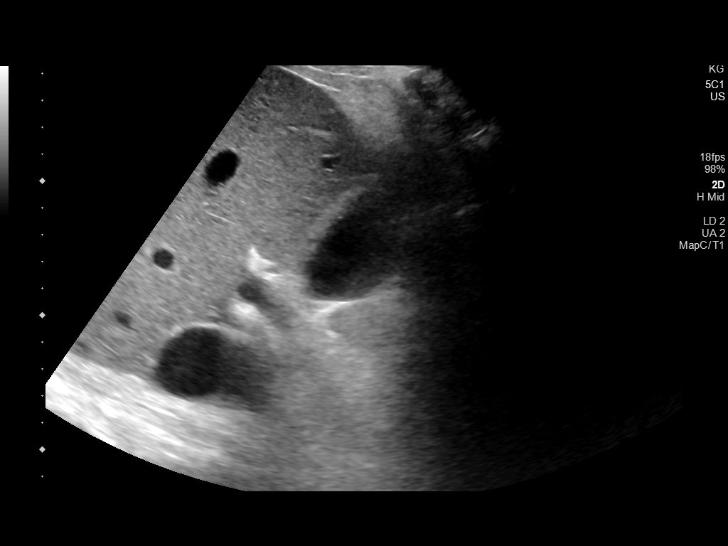
[im 4/43]
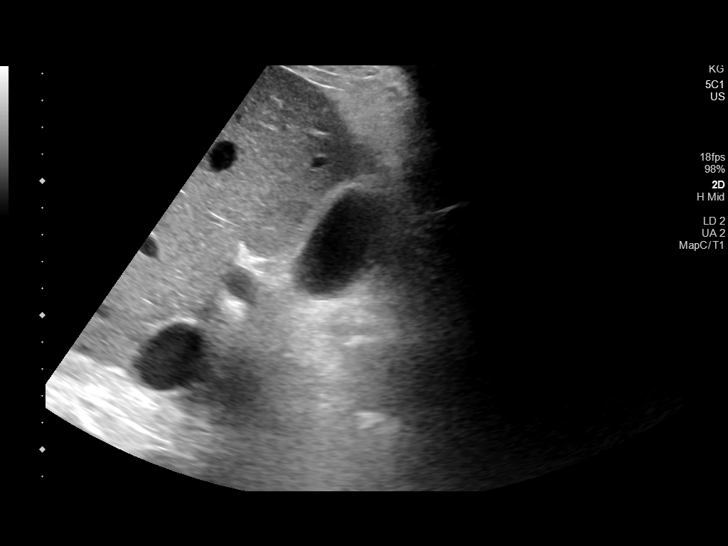
[im 8/43]
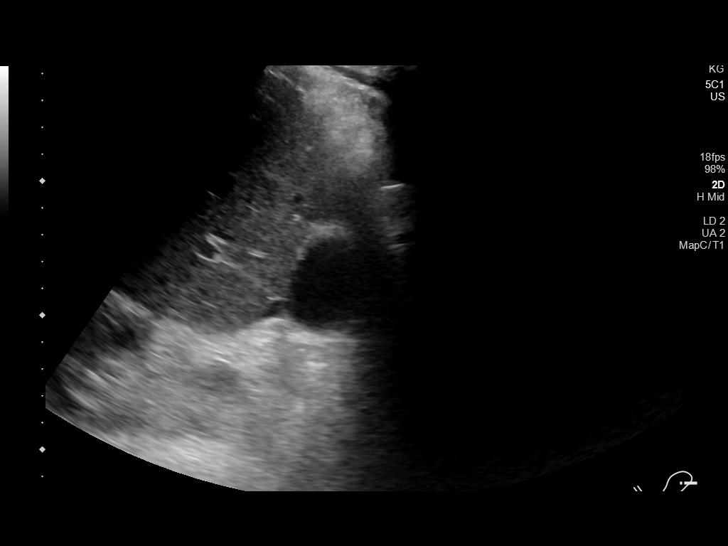
[im 11/43]
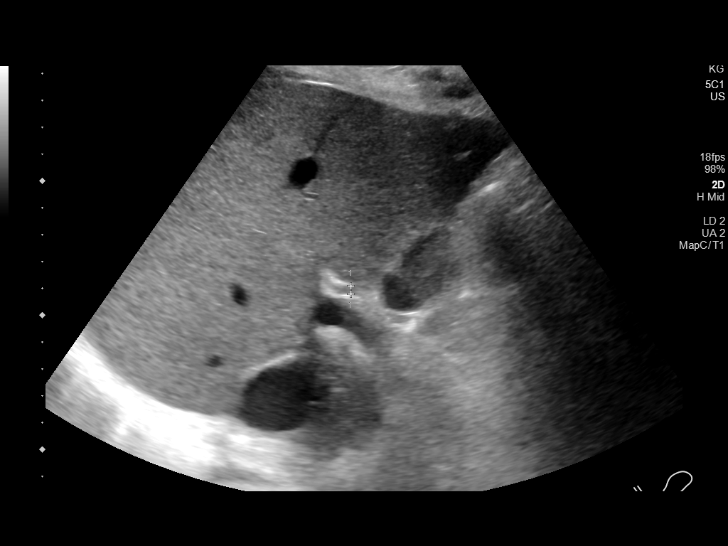
[im 15/43]
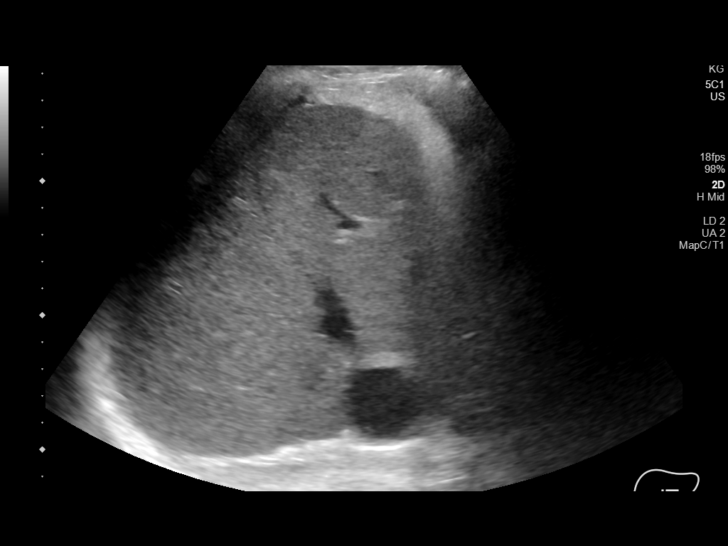
[im 16/43]
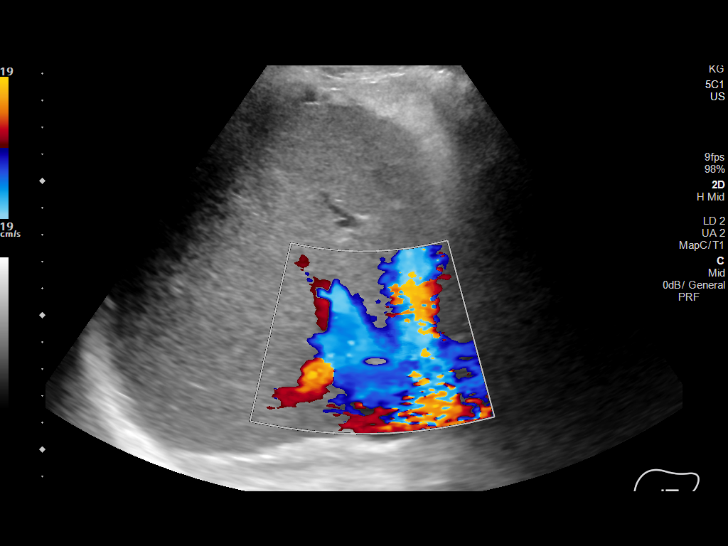
[im 20/43]
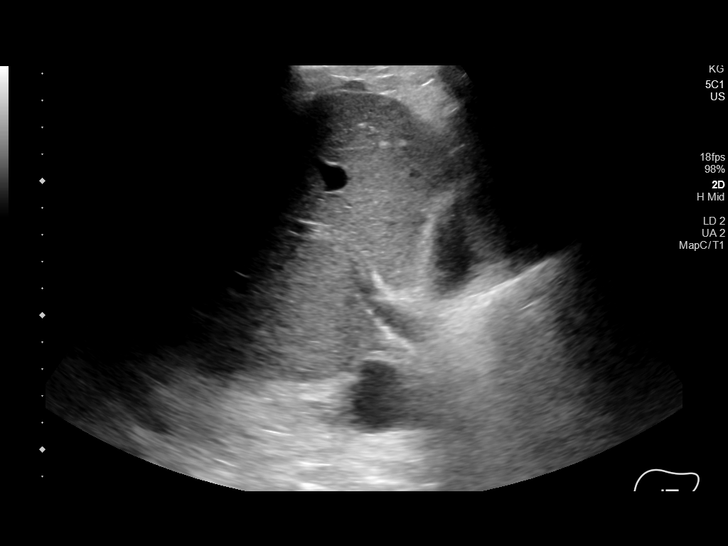
[im 23/43]
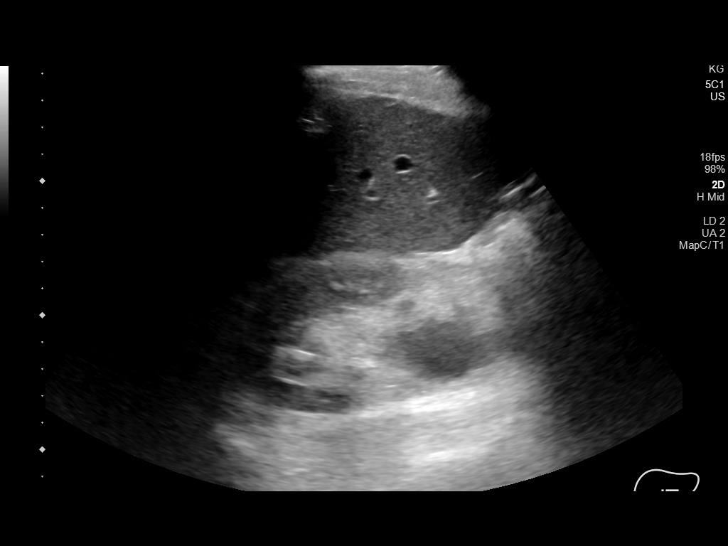
[im 27/43]
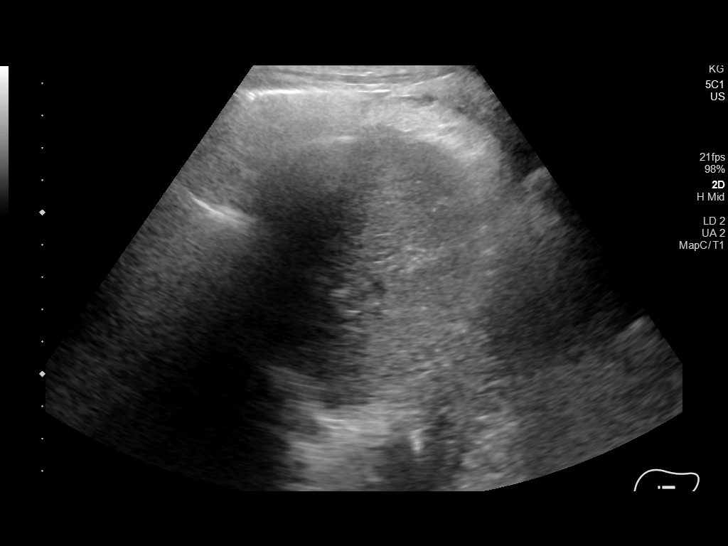
[im 29/43]
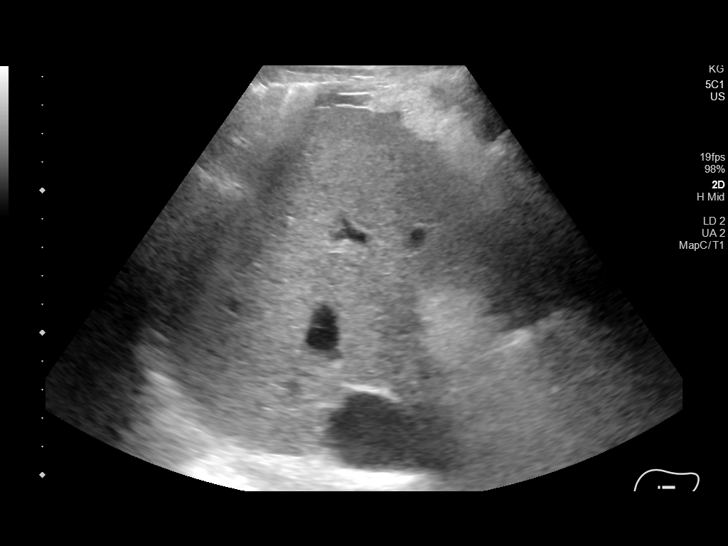
[im 32/43]
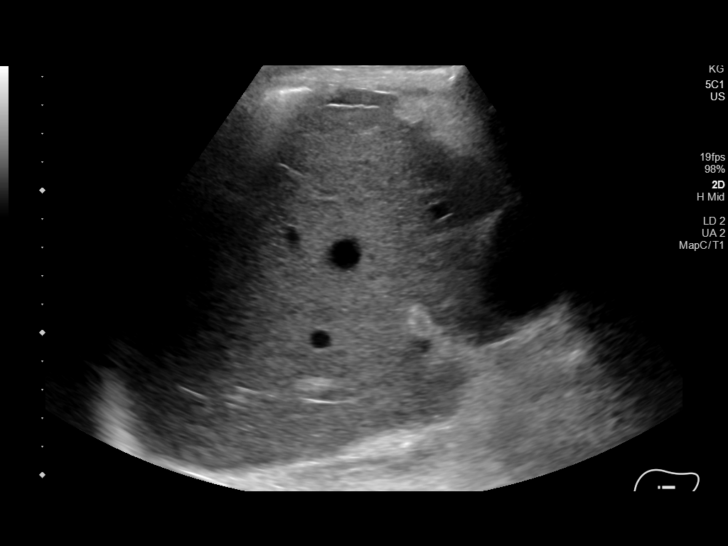
[im 36/43]
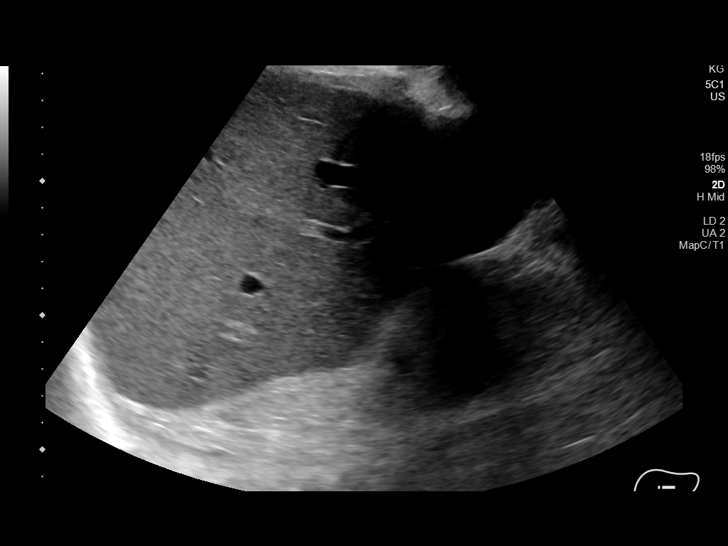
[im 39/43]
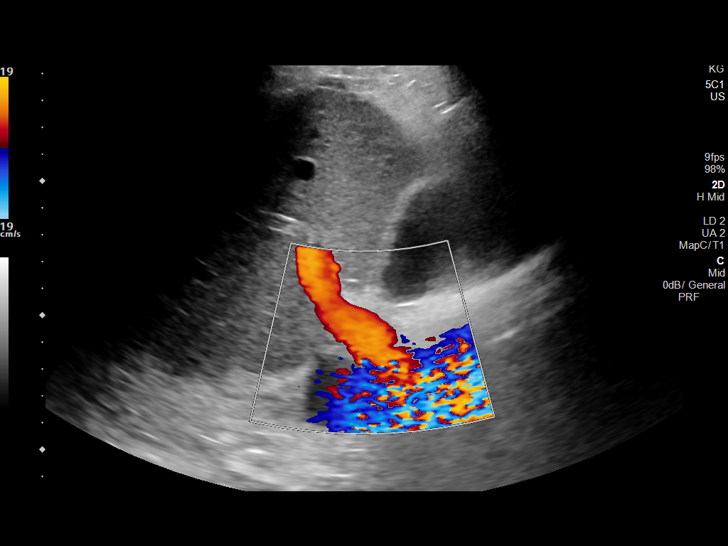
[im 43/43]
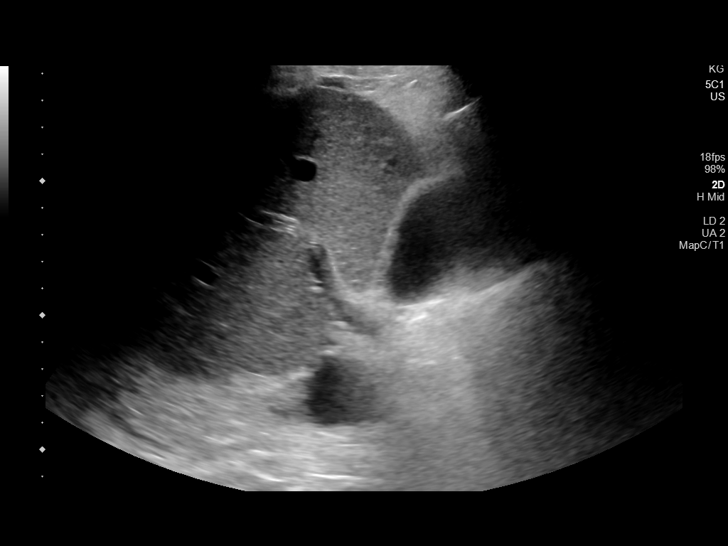

[14 of 25 positions shown; findings below may reference images not displayed]

FINDINGS: Gallbladder:

No gallstones or wall thickening visualized. No sonographic Murphy
sign noted by sonographer.

Common bile duct:

Diameter: 3 mm

Liver:

No focal lesion identified. Within normal limits in parenchymal
echogenicity. Portal vein is patent on color Doppler imaging with
normal direction of blood flow towards the liver.

Other: Distended IVC that is not changed with respiration per
report.

Visualization of the right kidney specifically performed on
ultrasound yesterday.
IMPRESSION: 1. Negative sonography of the liver and gallbladder.
2. Distended IVC, are there symptoms of right heart failure?

## 2021-12-02 DIAGNOSIS — C7951 Secondary malignant neoplasm of bone: Secondary | ICD-10-CM | POA: Diagnosis not present

## 2021-12-02 DIAGNOSIS — R338 Other retention of urine: Secondary | ICD-10-CM | POA: Diagnosis not present

## 2021-12-05 ENCOUNTER — Ambulatory Visit: Payer: Medicare Other | Admitting: Cardiology

## 2021-12-05 ENCOUNTER — Other Ambulatory Visit: Payer: Self-pay

## 2021-12-05 ENCOUNTER — Encounter: Payer: Self-pay | Admitting: Cardiology

## 2021-12-05 VITALS — BP 110/61 | HR 78 | Temp 98.1°F | Resp 17 | Ht 67.5 in | Wt 153.4 lb

## 2021-12-05 DIAGNOSIS — I4821 Permanent atrial fibrillation: Secondary | ICD-10-CM

## 2021-12-05 NOTE — Progress Notes (Signed)
Primary Physician/Referring:  Merri Brunette, MD  Patient ID: Todd Wilkinson, male    DOB: 27-Dec-1924, 88 y.o.   MRN: [id number]  Chief Complaint  Patient presents with   Atrial Fibrillation   moderate mitral regurgitation    6 months   HPI:    Todd Wilkinson  is a 88 y.o. Caucasian male with mild hyperlipidemia, chronic atrial fibrillation diagnosed in Feb 2021 but otherwise no significant prior cardiovascular history, no history of fall, no stroke in the past, no history of hypertension or diabetes mellitus.  He has obstructive uropathy and bilateral hydronephrosis from renal stones, UTI and severely enlarged prostate, hence has an indwelling chronic urinary catheter.  Last UTI was in December 2022.  He is presently doing well, no dyspnea, no chest pain or palpitations, no syncope.   Past Medical History:  Diagnosis Date   Atrial fibrillation (HCC)    Coronary artery disease    Hyperlipidemia    Prostate cancer [facility])    Past Surgical History:  Procedure Laterality Date   BACK SURGERY     CYSTOSCOPY WITH STENT PLACEMENT Bilateral 01/09/2020   Procedure: CYSTOSCOPY WITH RETROGRADE PYELOGRAM AND STENT PLACEMENT;  Surgeon: Rene Paci, MD;  Location: [facility];  Service: Urology;  Laterality: Bilateral;   Social History   Tobacco Use   Smoking status: Former    Types: Cigarettes    Quit date: 11/17/1946    Years since quitting: 75.1   Smokeless tobacco: Never  Substance Use Topics   Alcohol use: Not Currently    ROS  Review of Systems  Cardiovascular:  Negative for claudication, dyspnea on exertion and near-syncope.  Hematologic/Lymphatic: Does not bruise/bleed easily.  Gastrointestinal:  Negative for melena.  Genitourinary:  Positive for hesitancy (has indwelling catheter).  Neurological:  Negative for headaches and loss of balance.  Objective  Blood pressure 110/61, pulse 78, temperature 98.1 F (36.7 C), temperature source Temporal, resp. rate 17, height  5' 7.5" (1.715 m), weight 153 lb 6.4 oz (69.6 kg), SpO2 98 %.  Vitals with BMI 12/05/2021 10/12/2021 07/30/2021  Height 5' 7.5" 5' 7.5" -  Weight 153 lbs 6 oz 164 lbs 165 lbs 6 oz  BMI 23.66 25.29 -  Systolic 110 150 -  Diastolic 61 64 -  Pulse 78 59 -     Physical Exam Constitutional:      Appearance: He is well-developed.  Neck:     Vascular: No carotid bruit or JVD.  Cardiovascular:     Rate and Rhythm: Normal rate and regular rhythm.     Pulses:          Carotid pulses are 2+ on the right side and 2+ on the left side.      Femoral pulses are 2+ on the right side and 2+ on the left side.      Popliteal pulses are 2+ on the right side and 2+ on the left side.       Dorsalis pedis pulses are 2+ on the right side and 0 on the left side.       Posterior tibial pulses are 0 on the right side and 1+ on the left side.     Heart sounds: S1 normal and S2 normal. A midsystolic click. Murmur heard.  Crescendo-decrescendo mid to late systolic murmur is present with a grade of 2/6 at the apex.    No gallop.  Pulmonary:     Effort: Pulmonary effort is normal.     Breath  sounds: Normal breath sounds.  Abdominal:     General: Bowel sounds are normal.     Palpations: Abdomen is soft.  Musculoskeletal:        General: Normal range of motion.     Right lower leg: No edema.     Left lower leg: Edema (Trace) present.  Skin:    Findings: Bruising (Superficial laceration on his left shin) present.   Laboratory examination:   Recent Labs    06/06/21 0954 10/12/21 1530  NA 139 131*  K 4.7 3.7  CL 103 98  CO2 22 22  GLUCOSE 93 140*  BUN 17 19  CREATININE 1.34* 1.22  CALCIUM 9.3 9.6  GFRNONAA  --  55*    CrCl cannot be calculated (Patient's most recent lab result is older than the maximum 21 days allowed.).  CMP Latest Ref Rng & Units 10/12/2021 06/06/2021 01/15/2020  Glucose 70 - 99 mg/dL 140(H) 93 83  BUN 8 - 23 mg/dL 19 17 25(H)  Creatinine 0.61 - 1.24 mg/dL 1.22 1.34(H) 0.93   Sodium 135 - 145 mmol/L 131(L) 139 144  Potassium 3.5 - 5.1 mmol/L 3.7 4.7 3.9  Chloride 98 - 111 mmol/L 98 103 115(H)  CO2 22 - 32 mmol/L $RemoveB'22 22 23  'lhVwvEes$ Calcium 8.9 - 10.3 mg/dL 9.6 9.3 8.3(L)  Total Protein 6.5 - 8.1 g/dL - - -  Total Bilirubin 0.3 - 1.2 mg/dL - - -  Alkaline Phos 38 - 126 U/L - - -  AST 15 - 41 U/L - - -  ALT 0 - 44 U/L - - -   CBC Latest Ref Rng & Units 10/12/2021 06/06/2021 01/14/2020  WBC 4.0 - 10.5 K/uL 9.3 7.1 10.3  Hemoglobin 13.0 - 17.0 g/dL 12.2(L) 12.9(L) 10.1(L)  Hematocrit 39.0 - 52.0 % 35.2(L) 36.3(L) 31.1(L)  Platelets 150 - 400 K/uL 140(L) 134(L) 159   External labs :   Labs 10/18/2019: Serum glucose 90 mg, BUN 14, creatinine 1.20, EGFR 52 mL, potassium 4.2, sodium 140.  CMP normal.  Cholesterol 103, triglycerides 61, HDL 45, LDL 44.  Hb 11.8/HCT 35.0, platelets 129, normal indicis.  Medications and allergies   Allergies  Allergen Reactions   Ciprofloxacin      Current Outpatient Medications on File Prior to Visit  Medication Sig Dispense Refill   b complex vitamins capsule Take 1 capsule by mouth daily.     cetirizine (ZYRTEC) 5 MG tablet Take 5 mg by mouth daily.     finasteride (PROSCAR) 5 MG tablet Take 1 tablet (5 mg total) by mouth daily. 30 tablet 3   LORazepam (ATIVAN) 0.5 MG tablet Take 0.5-1 mg by mouth 2 (two) times daily.     Multiple Vitamin (MULTIVITAMIN) tablet Take 1 tablet by mouth daily.     omeprazole (PRILOSEC) 20 MG capsule Take 20 mg by mouth daily as needed.     pravastatin (PRAVACHOL) 80 MG tablet Take 1 tablet by mouth daily.     spironolactone (ALDACTONE) 25 MG tablet Take 25 mg by mouth daily. Patient daughter is confirming the strength.     tamsulosin (FLOMAX) 0.4 MG CAPS capsule Take 0.4 mg by mouth.     XARELTO 15 MG TABS tablet TAKE 1 TABLET BY MOUTH DAILY WITH SUPPER 30 tablet 6   No current facility-administered medications on file prior to visit.    Radiology:   CT Abdomen 01/08/2020: 1. Foley catheter in  adequate position over the bladder. Mild symmetric bilateral hydronephrosis. Mild bilateral nephrolithiasis. No  ureteral stones present. Hyperdense material over the left UPJ and proximal ureter as well as over the distal right ureter suggesting hemorrhagic material. Mild ascites. 2. Abnormal left periaortic lymph node at the level of the aortic bifurcation measuring 2.7 cm by short axis. 3.  Mild prostatic enlargement. 4.  Aortic Atherosclerosis (ICD10-I70.0).  Mild cardiomegaly.  Cardiac Studies:   Lower Extremity Venous Duplex  02/08/2020: RIGHT:  - No evidence of common femoral vein obstruction.  LEFT:  - There is no evidence of deep vein thrombosis in the lower extremity.  - No cystic structure found in the popliteal fossa.   PCV ECHOCARDIOGRAM COMPLETE 32/12/3341 Normal LV systolic function with EF 56%. Left ventricle cavity is normal in size. Moderate concentric hypertrophy of the left ventricle. Normal global wall motion. Unable to evaluate diastolic function due to atrial fibrillation. Calculated EF 56%. Left atrial cavity is severely dilated at 5 cm. Volume index >60. Right atrial cavity is severely dilated. Right ventricle cavity is mildly dilated. Normal right ventricular function. Structurally normal mitral valve.  Mild to moderate mitral regurgitation. Moderate to severe tricuspid regurgitation. Dilation of the tricuspid valve annulus. Moderate pulmonary hypertension. RVSP measures 53 mmHg. The aortic root is mildly dilated at 3.7 cm. 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 No significant change from 01/02/2021, 04/11/2020.     EKG:   EKG 12/05/2021: Atrial fibrillation with controlled ventricular response at rate of 69 bpm, axis deviation, left anterior fascicular block, cannot exclude inferior infarct old.  Right bundle branch block.  PVCs.  No significant change from 06/06/2021  Assessment     ICD-10-CM   1.  Permanent atrial fibrillation (HCC)  I48.21 EKG 12-Lead      CHA2DS2-VASc Score is 3.  Yearly risk of stroke: 3.2% (A, Aortic atherosclerosis).  Score of 1=0.6; 2=2.2; 3=3.2; 4=4.8; 5=7.2; 6=9.8; 7=>9.8) -(CHF HTN; vasc disease DM,  Male = 1; Age <65 =0; 65-74 = 1,  >75 =2; stroke/embolism= 2).   Orders Placed This Encounter  Procedures   EKG 12-Lead    No orders of the defined types were placed in this encounter.    Medications Discontinued During This Encounter  Medication Reason   cephALEXin (KEFLEX) 500 MG capsule    KLOR-CON 10 10 MEQ tablet    tamsulosin (FLOMAX) 0.4 MG CAPS capsule     Recommendations:   JHALEN ELEY  is a 88 y.o. Caucasian male with mild hyperlipidemia, chronic atrial fibrillation diagnosed in Feb 2021 but otherwise no significant prior cardiovascular history, no history of fall, no stroke in the past, no history of hypertension or diabetes mellitus.  He has obstructive uropathy and bilateral hydronephrosis from renal stones, UTI and severely enlarged prostate, hence has an indwelling chronic urinary catheter.  Patient's renal function has remained stable, CBC has remained stable, he is tolerating anticoagulation without bleeding diathesis.  Blood pressure is also well controlled on atrial fibrillation rate is well controlled as well.  No clinical evidence of heart failure.  Cardiac examination also does not reveal any change in heart murmur.    No changes in the medication today, I will see him back in 6 months.    Adrian Prows, MD, [facility] 12/05/2021, [place] [facility] Office: [phone]

## 2021-12-10 ENCOUNTER — Other Ambulatory Visit: Payer: Self-pay | Admitting: Cardiology

## 2021-12-10 DIAGNOSIS — I4821 Permanent atrial fibrillation: Secondary | ICD-10-CM

## 2021-12-20 DIAGNOSIS — R338 Other retention of urine: Secondary | ICD-10-CM | POA: Diagnosis not present

## 2022-01-21 DIAGNOSIS — R338 Other retention of urine: Secondary | ICD-10-CM | POA: Diagnosis not present

## 2022-02-20 DIAGNOSIS — R338 Other retention of urine: Secondary | ICD-10-CM | POA: Diagnosis not present

## 2022-03-20 DIAGNOSIS — R338 Other retention of urine: Secondary | ICD-10-CM | POA: Diagnosis not present

## 2022-04-04 DIAGNOSIS — G8929 Other chronic pain: Secondary | ICD-10-CM | POA: Diagnosis not present

## 2022-04-04 DIAGNOSIS — M545 Low back pain, unspecified: Secondary | ICD-10-CM | POA: Diagnosis not present

## 2022-04-22 DIAGNOSIS — K219 Gastro-esophageal reflux disease without esophagitis: Secondary | ICD-10-CM | POA: Diagnosis not present

## 2022-04-22 DIAGNOSIS — I2510 Atherosclerotic heart disease of native coronary artery without angina pectoris: Secondary | ICD-10-CM | POA: Diagnosis not present

## 2022-04-22 DIAGNOSIS — I10 Essential (primary) hypertension: Secondary | ICD-10-CM | POA: Diagnosis not present

## 2022-04-22 DIAGNOSIS — Z79899 Other long term (current) drug therapy: Secondary | ICD-10-CM | POA: Diagnosis not present

## 2022-04-22 DIAGNOSIS — R634 Abnormal weight loss: Secondary | ICD-10-CM | POA: Diagnosis not present

## 2022-04-22 DIAGNOSIS — R1310 Dysphagia, unspecified: Secondary | ICD-10-CM | POA: Diagnosis not present

## 2022-04-22 DIAGNOSIS — I4821 Permanent atrial fibrillation: Secondary | ICD-10-CM | POA: Diagnosis not present

## 2022-04-24 DIAGNOSIS — C7951 Secondary malignant neoplasm of bone: Secondary | ICD-10-CM | POA: Diagnosis not present

## 2022-04-24 DIAGNOSIS — R338 Other retention of urine: Secondary | ICD-10-CM | POA: Diagnosis not present

## 2022-06-05 ENCOUNTER — Ambulatory Visit: Payer: Medicare Other | Admitting: Cardiology

## 2022-06-17 DEATH — deceased
